# Patient Record
Sex: Female | Born: 2000 | Hispanic: No | Marital: Single | Smoking: Never smoker
Health system: Southern US, Community
[De-identification: ages and names within clinical notes are randomized; demographics above are authoritative.]

## PROBLEM LIST (undated history)

## (undated) DIAGNOSIS — Z8619 Personal history of other infectious and parasitic diseases: Secondary | ICD-10-CM

## (undated) HISTORY — DX: Personal history of other infectious and parasitic diseases: Z86.19

## (undated) HISTORY — PX: WISDOM TOOTH EXTRACTION: SHX21

---
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
## 2021-01-03 ENCOUNTER — Ambulatory Visit (HOSPITAL_COMMUNITY): Payer: Self-pay

## 2021-01-21 ENCOUNTER — Encounter: Payer: Self-pay | Admitting: Student

## 2021-01-21 ENCOUNTER — Inpatient Hospital Stay (HOSPITAL_COMMUNITY)
Admission: AD | Admit: 2021-01-21 | Discharge: 2021-01-21 | Disposition: A | Discharge status: Home / Self Care | Payer: BC Managed Care – PPO | Attending: Obstetrics & Gynecology | Admitting: Obstetrics & Gynecology

## 2021-01-21 ENCOUNTER — Other Ambulatory Visit: Payer: Self-pay

## 2021-01-21 DIAGNOSIS — N926 Irregular menstruation, unspecified: Secondary | ICD-10-CM

## 2021-01-21 NOTE — Discharge Instructions (Signed)
Return to care  If you have heavier bleeding that soaks through more that 2 pads per hour for an hour or more If you bleed so much that you feel like you might pass out or you do pass out If you have significant abdominal pain that is not improved with Tylenol     Safe Medications in Pregnancy   Acne: Benzoyl Peroxide Salicylic Acid  Backache/Headache: Tylenol: 2 regular strength every 4 hours OR              2 Extra strength every 6 hours  Colds/Coughs/Allergies: Benadryl (alcohol free) 25 mg every 6 hours as needed Breath right strips Claritin Cepacol throat lozenges Chloraseptic throat spray Cold-Eeze- up to three times per day Cough drops, alcohol free Flonase (by prescription only) Guaifenesin Mucinex Robitussin DM (plain only, alcohol free) Saline nasal spray/drops Sudafed (pseudoephedrine) & Actifed ** use only after [redacted] weeks gestation and if you do not have high blood pressure Tylenol Vicks Vaporub Zinc lozenges Zyrtec   Constipation: Colace Ducolax suppositories Fleet enema Glycerin suppositories Metamucil Milk of magnesia Miralax Senokot Smooth move tea  Diarrhea: Kaopectate Imodium A-D  *NO pepto Bismol  Hemorrhoids: Anusol Anusol HC Preparation H Tucks  Indigestion: Tums Maalox Mylanta Zantac  Pepcid  Insomnia: Benadryl (alcohol free) 25mg every 6 hours as needed Tylenol PM Unisom, no Gelcaps  Leg Cramps: Tums MagGel  Nausea/Vomiting:  Bonine Dramamine Emetrol Ginger extract Sea bands Meclizine  Nausea medication to take during pregnancy:  Unisom (doxylamine succinate 25 mg tablets) Take one tablet daily at bedtime. If symptoms are not adequately controlled, the dose can be increased to a maximum recommended dose of two tablets daily (1/2 tablet in the morning, 1/2 tablet mid-afternoon and one at bedtime). Vitamin B6 100mg tablets. Take one tablet twice a day (up to 200 mg per day).  Skin Rashes: Aveeno  products Benadryl cream or 25mg every 6 hours as needed Calamine Lotion 1% cortisone cream  Yeast infection: Gyne-lotrimin 7 Monistat 7  Gum/tooth pain: Anbesol  **If taking multiple medications, please check labels to avoid duplicating the same active ingredients **take medication as directed on the label ** Do not exceed 4000 mg of tylenol in 24 hours **Do not take medications that contain aspirin or ibuprofen    

## 2021-01-21 NOTE — MAU Note (Signed)
Took a pregnancy test today, was positive.  Has been cramping in lower abd a couple wks ago, none since. No bleeding..  Wanting confirmation.  Recently preg- IUFD at [facility] in Feb.

## 2021-01-21 NOTE — MAU Provider Note (Signed)
  S:   20 y.o. G1P0010 @Unknown  by LMP presents to MAU for pregnancy confirmation.  She denies abdominal pain or vaginal bleeding today.    O: BP 127/67 (BP Location: Right Arm)   Pulse 81   Temp 98.6 F (37 C) (Oral)   Resp 16   Ht 5' 3.5" (1.613 m)   Wt 80.3 kg   LMP 12/21/2020   SpO2 100%   Breastfeeding Unknown   BMI 30.86 kg/m  Physical Examination: General appearance - alert, well appearing, and in no distress, oriented to person, place, and time and acyanotic, in no respiratory distress  No results found for this or any previous visit (from the past 48 hour(s)).  A: Missed menses  P: D/C home Informed pt we do not do pregnancy verifications in MAU Patient is a [facility] patient but would like to go to [facility] - had recent 2nd trimester loss - will send message to office to have pregnancy confirmation & set up for viability  [facility], NP 5:25 PM

## 2021-01-25 ENCOUNTER — Telehealth: Payer: Self-pay | Admitting: Radiology

## 2021-01-25 NOTE — Telephone Encounter (Signed)
Left voicemail of appointment time change from 8:30 to 11:30 on 02/10/21

## 2021-02-10 ENCOUNTER — Other Ambulatory Visit: Payer: Self-pay

## 2021-02-10 ENCOUNTER — Ambulatory Visit (INDEPENDENT_AMBULATORY_CARE_PROVIDER_SITE_OTHER): Payer: BC Managed Care – PPO | Admitting: *Deleted

## 2021-02-10 ENCOUNTER — Ambulatory Visit: Payer: Self-pay

## 2021-02-10 ENCOUNTER — Encounter: Payer: Self-pay | Admitting: *Deleted

## 2021-02-10 VITALS — BP 127/72 | HR 73 | Wt 184.0 lb

## 2021-02-10 DIAGNOSIS — Z3A01 Less than 8 weeks gestation of pregnancy: Secondary | ICD-10-CM | POA: Diagnosis not present

## 2021-02-10 DIAGNOSIS — N926 Irregular menstruation, unspecified: Secondary | ICD-10-CM

## 2021-02-10 DIAGNOSIS — O3680X0 Pregnancy with inconclusive fetal viability, not applicable or unspecified: Secondary | ICD-10-CM

## 2021-02-10 DIAGNOSIS — Z3480 Encounter for supervision of other normal pregnancy, unspecified trimester: Secondary | ICD-10-CM | POA: Insufficient documentation

## 2021-02-10 DIAGNOSIS — Z3201 Encounter for pregnancy test, result positive: Secondary | ICD-10-CM

## 2021-02-10 DIAGNOSIS — O0990 Supervision of high risk pregnancy, unspecified, unspecified trimester: Secondary | ICD-10-CM | POA: Insufficient documentation

## 2021-02-10 LAB — POCT URINE PREGNANCY: Preg Test, Ur: POSITIVE — AB

## 2021-02-10 NOTE — Progress Notes (Signed)
DATING AND VIABILITY SONOGRAM   Amy Singh is a 20 y.o. year old G2P0010 with LMP Patient's last menstrual period was 12/21/2020. which would correlate to  [redacted]w[redacted]d weeks gestation.  This was her first period since her IUFD in February.  She is here today for a confirmatory initial sonogram.  Reviewed allergies  and medication. Pt to start taking PNV. Reviewed medical/surgical and OB history.  Denies any pain or VB.    GESTATION: SINGLETON Yes  FETAL ACTIVITY:          Heart rate      119          The fetus is current.    GESTATIONAL AGE AND  BIOMETRICS:  Gestational criteria: Estimated Date of Delivery: 09/27/21 by LMP now at [redacted]w[redacted]d  Previous Scans:0  GESTATIONAL SAC            mm         weeks  CROWN RUMP LENGTH           0.67cm         6.5 weeks                                                   AVERAGE EGA(BY THIS SCAN):  6.5 weeks  WORKING EDD( LMP ):  09/27/2021     TECHNICIAN COMMENTS: Patient informed that the ultrasound is considered a limited obstetric ultrasound and is not intended to be a complete ultrasound exam.  Patient also informed that the ultrasound is not being completed with the intent of assessing for fetal or placental anomalies or any pelvic abnormalities. Explained that the purpose of today's ultrasound is to assess for fetal heart rate.  Patient acknowledges the purpose of the exam and the limitations of the study.           A copy of this report including all images has been saved and backed up to a second source for retrieval if needed. All measures and details of the anatomical scan, placentation, fluid volume and pelvic anatomy are contained in that report.   Pt to follow up in 3-4 week for New OB visit with provider.   Scheryl Marten 02/10/2021 12:06 PM

## 2021-02-10 NOTE — Progress Notes (Signed)
Patient was assessed and managed by nursing staff during this encounter. I have reviewed the chart and agree with the documentation and plan.   Jaynie Collins, MD 02/10/2021 12:57 PM

## 2021-03-14 ENCOUNTER — Encounter: Payer: Self-pay | Admitting: Obstetrics and Gynecology

## 2021-03-14 ENCOUNTER — Ambulatory Visit (INDEPENDENT_AMBULATORY_CARE_PROVIDER_SITE_OTHER): Payer: BC Managed Care – PPO | Admitting: Obstetrics and Gynecology

## 2021-03-14 ENCOUNTER — Other Ambulatory Visit: Payer: Self-pay

## 2021-03-14 ENCOUNTER — Other Ambulatory Visit (HOSPITAL_COMMUNITY)
Admission: RE | Admit: 2021-03-14 | Discharge: 2021-03-14 | Disposition: A | Discharge status: Home / Self Care | Payer: BC Managed Care – PPO | Source: Ambulatory Visit | Attending: Obstetrics and Gynecology | Admitting: Obstetrics and Gynecology

## 2021-03-14 ENCOUNTER — Other Ambulatory Visit: Payer: Self-pay | Admitting: Obstetrics and Gynecology

## 2021-03-14 VITALS — BP 123/73 | HR 80 | Wt 184.6 lb

## 2021-03-14 DIAGNOSIS — O0991 Supervision of high risk pregnancy, unspecified, first trimester: Secondary | ICD-10-CM | POA: Insufficient documentation

## 2021-03-14 DIAGNOSIS — Z8759 Personal history of other complications of pregnancy, childbirth and the puerperium: Secondary | ICD-10-CM | POA: Insufficient documentation

## 2021-03-14 DIAGNOSIS — O98811 Other maternal infectious and parasitic diseases complicating pregnancy, first trimester: Secondary | ICD-10-CM | POA: Insufficient documentation

## 2021-03-14 DIAGNOSIS — Z3A11 11 weeks gestation of pregnancy: Secondary | ICD-10-CM | POA: Insufficient documentation

## 2021-03-14 DIAGNOSIS — Z3481 Encounter for supervision of other normal pregnancy, first trimester: Secondary | ICD-10-CM | POA: Diagnosis not present

## 2021-03-14 DIAGNOSIS — A749 Chlamydial infection, unspecified: Secondary | ICD-10-CM | POA: Insufficient documentation

## 2021-03-14 DIAGNOSIS — Z31430 Encounter of female for testing for genetic disease carrier status for procreative management: Secondary | ICD-10-CM | POA: Diagnosis not present

## 2021-03-14 DIAGNOSIS — O98212 Gonorrhea complicating pregnancy, second trimester: Secondary | ICD-10-CM

## 2021-03-14 DIAGNOSIS — O98812 Other maternal infectious and parasitic diseases complicating pregnancy, second trimester: Secondary | ICD-10-CM

## 2021-03-14 DIAGNOSIS — O98211 Gonorrhea complicating pregnancy, first trimester: Secondary | ICD-10-CM | POA: Diagnosis not present

## 2021-03-14 MED ORDER — PREPLUS 27-1 MG PO TABS: 1.0000 | ORAL_TABLET | Freq: Every day | ORAL | 13 refills | Status: DC

## 2021-03-14 MED ORDER — ASPIRIN EC 81 MG PO TBEC: 81.0000 mg | DELAYED_RELEASE_TABLET | Freq: Every day | ORAL | 2 refills | Status: DC

## 2021-03-14 NOTE — Progress Notes (Signed)
New OB Note  03/14/2021   Clinic: [facility]  Chief Complaint: New OB  Transfer of Care Patient: no  History of Present Illness: [name] is a 20 y.o. G2P0010 @ 11/6 weeks (EDC 09/27/2021, based on Patient's last menstrual period was 12/21/2020.=6wk u/s).  Preg complicated by has History of marijuana use; IUFD at less than 20 weeks of gestation; and Supervision of other normal pregnancy, antepartum on their problem list.   Any events prior to today's visit: no Her periods were: qmonth, regular She was using no method when she conceived.  She has Negative signs or symptoms of nausea/vomiting of pregnancy. She has Negative signs or symptoms of miscarriage or preterm labor  ROS: A 12-point review of systems was performed and negative, except as stated in the above HPI.  OBGYN History: As per HPI. 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   Qualifier: Diagnosis of  By: Barbaraann Barthel MD, [place]      Past Surgical History: Past Surgical History:  Procedure Laterality Date   WISDOM TOOTH EXTRACTION      Family History:  Family History  Problem Relation Age of Onset   Diabetes Paternal Grandmother     Social History:  Social History   Socioeconomic History   Marital status: Single    Spouse name: Not on file   Number of children: Not on file   Years of education: Not on file   Highest education level: Not on file  Occupational History   Not on file  Tobacco Use   Smoking status: Never   Smokeless tobacco: Never  Vaping Use   Vaping Use: Never  used  Substance and Sexual Activity   Alcohol use: No   Drug use: Not Currently    Types: Marijuana    Comment: 10/22/20   Sexual activity: Yes    Partners: Male    Comment: Pregnant   Other Topics Concern   Not on file  Social History Narrative   Not on file   Social Determinants of Health   Financial Resource Strain: Not on file  Food Insecurity: Not on file  Transportation Needs: Not on file  Physical Activity: Not on file  Stress: Not on file  Social Connections: Not on file  Intimate Partner Violence: Not on file    Allergy: No Known Allergies  Current Outpatient Medications: Prenatal vitamin  Physical Exam:   BP 123/73   Pulse 80   Wt 184 lb 9.6 oz (83.7 kg)   LMP 12/21/2020   BMI 32.19 kg/m  Body mass index is 32.19 kg/m. Contractions: Not present Vag. Bleeding: None. Fundal height: not applicable FHTs: 160s  General appearance: Well nourished, well developed female in no acute distress.  Neck:  Supple, normal appearance, and no thyromegaly  Cardiovascular: S1, S2 normal, no murmur, rub or gallop,  regular rate and rhythm Respiratory:  Clear to auscultation bilateral. Normal respiratory effort Abdomen: positive bowel sounds and no masses, hernias; diffusely non tender to palpation, non distended Breasts: patient denies any breast s/s Neuro/Psych:  Normal mood and affect.  Skin:  Warm and dry.   Laboratory: none  Imaging:  none  Assessment: patient doing well  Plan: 1. Supervision of high risk pregnancy in first trimester Routine care. Pt amenable to starting low dose ASA in a week or so. - CBC/D/Plt+RPR+Rh+ABO+RubIgG... - Culture, OB Urine - Genetic Screening - Cervicovaginal ancillary only - Hemoglobin A1c - TSH - Lupus anticoagulant - Cardiolipin antibodies, IgM+IgG - Beta-2 Glycoprotein I Ab,G/M - Antiphospholipid syndrome eval, bld - [place] MFM OB DETAIL +14 WK; Future - Comprehensive metabolic panel - Protein / creatinine ratio,  urine  2. History of IUFD Patient showed up to 2/23 anatomy u/s and d/w with IUFD and measuring 16wks instead at 19wks; pt had good dates. Anora showed normal female karyotype. Placenta not sent. Will do limited thrombophilia w/u - CBC/D/Plt+RPR+Rh+ABO+RubIgG... - Culture, OB Urine - Genetic Screening - Cervicovaginal ancillary only - Hemoglobin A1c - TSH - Lupus anticoagulant - Cardiolipin antibodies, IgM+IgG - Beta-2 Glycoprotein I Ab,G/M - Antiphospholipid syndrome eval, bld - [place] MFM OB DETAIL +14 WK; Future  3. [redacted] weeks gestation of pregnancy  Problem list reviewed and updated.  Follow up in 3 weeks.  The nature of [place] [facility] with multiple MDs and other Advanced Practice Providers was explained to patient; also emphasized that residents, students are part of our team.  >50% of 35 min visit spent on counseling and coordination of care.     Cornelia Copa MD Attending Center for [facility] [place])

## 2021-03-14 NOTE — Patient Instructions (Signed)
Today you are 11 weeks and 6 days  Your due date is September 27, 2021

## 2021-03-15 LAB — PROTEIN / CREATININE RATIO, URINE
Creatinine, Urine: 137.3 mg/dL
Protein, Ur: 9.9 mg/dL
Protein/Creat Ratio: 72 mg/g creat (ref 0–200)

## 2021-03-16 ENCOUNTER — Telehealth: Payer: Self-pay | Admitting: *Deleted

## 2021-03-16 DIAGNOSIS — O98219 Gonorrhea complicating pregnancy, unspecified trimester: Secondary | ICD-10-CM | POA: Insufficient documentation

## 2021-03-16 DIAGNOSIS — A749 Chlamydial infection, unspecified: Secondary | ICD-10-CM | POA: Insufficient documentation

## 2021-03-16 DIAGNOSIS — O98211 Gonorrhea complicating pregnancy, first trimester: Secondary | ICD-10-CM | POA: Insufficient documentation

## 2021-03-16 DIAGNOSIS — O98819 Other maternal infectious and parasitic diseases complicating pregnancy, unspecified trimester: Secondary | ICD-10-CM

## 2021-03-16 HISTORY — DX: Gonorrhea complicating pregnancy, unspecified trimester: O98.219

## 2021-03-16 HISTORY — DX: Chlamydial infection, unspecified: A74.9

## 2021-03-16 HISTORY — DX: Other maternal infectious and parasitic diseases complicating pregnancy, unspecified trimester: O98.819

## 2021-03-16 LAB — CBC/D/PLT+RPR+RH+ABO+RUBIGG...
Antibody Screen: NEGATIVE
Basophils Absolute: 0 10*3/uL (ref 0.0–0.2)
Basos: 0 %
EOS (ABSOLUTE): 0.1 10*3/uL (ref 0.0–0.4)
Eos: 1 %
HCV Ab: <0.1 s/co ratio (ref 0.0–0.9)
HIV Screen 4th Generation wRfx: NONREACTIVE
Hematocrit: 37.3 % (ref 34.0–46.6)
Hemoglobin: 11.9 g/dL (ref 11.1–15.9)
Hepatitis B Surface Ag: NEGATIVE
Immature Grans (Abs): 0 10*3/uL (ref 0.0–0.1)
Immature Granulocytes: 0 %
Lymphocytes Absolute: 2.2 10*3/uL (ref 0.7–3.1)
Lymphs: 26 %
MCH: 26.3 pg — ABNORMAL LOW (ref 26.6–33.0)
MCHC: 31.9 g/dL (ref 31.5–35.7)
MCV: 83 fL (ref 79–97)
Monocytes Absolute: 0.5 10*3/uL (ref 0.1–0.9)
Monocytes: 6 %
Neutrophils Absolute: 5.5 10*3/uL (ref 1.4–7.0)
Neutrophils: 67 %
Platelets: 206 10*3/uL (ref 150–450)
RBC: 4.52 x10E6/uL (ref 3.77–5.28)
RDW: 13.2 % (ref 11.7–15.4)
RPR Ser Ql: NONREACTIVE
Rh Factor: POSITIVE
Rubella Antibodies, IGG: 5.43 index (ref >0.99)
WBC: 8.3 10*3/uL (ref 3.4–10.8)

## 2021-03-16 LAB — COMPREHENSIVE METABOLIC PANEL
ALT: 9 IU/L (ref 0–32)
AST: 14 IU/L (ref 0–40)
Albumin/Globulin Ratio: 1.8 (ref 1.2–2.2)
Albumin: 4.4 g/dL (ref 3.9–5.0)
Alkaline Phosphatase: 48 IU/L (ref 42–106)
BUN/Creatinine Ratio: 16 (ref 9–23)
BUN: 10 mg/dL (ref 6–20)
Bilirubin Total: 0.5 mg/dL (ref 0.0–1.2)
CO2: 19 mmol/L — ABNORMAL LOW (ref 20–29)
Calcium: 9.5 mg/dL (ref 8.7–10.2)
Chloride: 104 mmol/L (ref 96–106)
Creatinine, Ser: 0.64 mg/dL (ref 0.57–1.00)
Globulin, Total: 2.4 g/dL (ref 1.5–4.5)
Glucose: 72 mg/dL (ref 65–99)
Potassium: 4.2 mmol/L (ref 3.5–5.2)
Sodium: 136 mmol/L (ref 134–144)
Total Protein: 6.8 g/dL (ref 6.0–8.5)
eGFR: 131 mL/min/{1.73_m2} (ref >59)

## 2021-03-16 LAB — ANTIPHOSPHOLIPID SYNDROME EVAL, BLD
APTT PPP: 24.1 s (ref 22.9–30.2)
Anticardiolipin IgG: <9 GPL U/mL (ref 0–14)
Anticardiolipin IgM: <9 MPL U/mL (ref 0–12)
Beta-2 Glyco 1 IgM: <9 GPI IgM units (ref 0–32)
Beta-2 Glyco I IgG: <9 GPI IgG units (ref 0–20)
Hexagonal Phase Phospholipid: 3 s (ref 0–11)
INR: 0.9 (ref 0.9–1.2)
PT: 9.6 s (ref 9.1–12.0)

## 2021-03-16 LAB — TSH: TSH: 1.15 u[IU]/mL (ref 0.450–4.500)

## 2021-03-16 LAB — LUPUS ANTICOAGULANT
Dilute Viper Venom Time: 30.3 s (ref 0.0–47.0)
PTT Lupus Anticoagulant: 30.9 s (ref 0.0–51.9)
Thrombin Time: 15.4 s (ref 0.0–23.0)
dPT Confirm Ratio: 1.02 Ratio (ref 0.00–1.34)
dPT: 30.6 s (ref 0.0–47.6)

## 2021-03-16 LAB — HEMOGLOBIN A1C
Est. average glucose Bld gHb Est-mCnc: 103 mg/dL
Hgb A1c MFr Bld: 5.2 % (ref 4.8–5.6)

## 2021-03-16 LAB — CERVICOVAGINAL ANCILLARY ONLY
Chlamydia: POSITIVE — AB
Comment: NEGATIVE
Comment: NEGATIVE
Comment: NORMAL
Neisseria Gonorrhea: POSITIVE — AB
Trichomonas: NEGATIVE

## 2021-03-16 LAB — HCV INTERPRETATION

## 2021-03-16 LAB — CULTURE, OB URINE

## 2021-03-16 LAB — URINE CULTURE, OB REFLEX

## 2021-03-16 LAB — COAG STUDIES INTERP REPORT

## 2021-03-16 MED ORDER — AZITHROMYCIN 500 MG PO TABS: 1000.0000 mg | ORAL_TABLET | Freq: Once | ORAL | 1 refills | Status: AC

## 2021-03-16 NOTE — Telephone Encounter (Signed)
-----   Message from [place] Bing, MD sent at 03/16/2021  3:15 PM EDT ----- Please call her and let her know. I've sent in the azithro. Please set her up to come in for the rocephin. thanks

## 2021-03-16 NOTE — Addendum Note (Signed)
Addended by: [place] Bing on: 03/16/2021 03:15 PM   Modules accepted: Orders

## 2021-03-16 NOTE — Telephone Encounter (Signed)
Pt called and is aware of results and will come in tomorrow for her injection.

## 2021-03-17 ENCOUNTER — Ambulatory Visit: Payer: BC Managed Care – PPO

## 2021-03-21 ENCOUNTER — Other Ambulatory Visit: Payer: Self-pay

## 2021-03-21 ENCOUNTER — Ambulatory Visit (INDEPENDENT_AMBULATORY_CARE_PROVIDER_SITE_OTHER): Payer: BC Managed Care – PPO | Admitting: *Deleted

## 2021-03-21 VITALS — BP 108/72 | HR 56

## 2021-03-21 DIAGNOSIS — Z3A12 12 weeks gestation of pregnancy: Secondary | ICD-10-CM

## 2021-03-21 DIAGNOSIS — O98211 Gonorrhea complicating pregnancy, first trimester: Secondary | ICD-10-CM

## 2021-03-21 MED ORDER — CEFTRIAXONE SODIUM 500 MG IJ SOLR
500.0000 mg | Freq: Once | INTRAMUSCULAR | Status: AC
  Administered 2021-03-21: 09:00:00 500 mg via INTRAMUSCULAR

## 2021-03-21 NOTE — Progress Notes (Signed)
Patient was assessed and managed by nursing staff during this encounter. I have reviewed the chart and agree with the documentation and plan. I have also made any necessary editorial changes.  [place] Bing, MD 03/21/2021 10:15 AM

## 2021-03-21 NOTE — Progress Notes (Signed)
Pt here for her Rocephin injection. Pt tolerated injection well. Will have TOC in about 4 weeks

## 2021-03-24 ENCOUNTER — Telehealth: Payer: Self-pay

## 2021-03-24 ENCOUNTER — Other Ambulatory Visit: Payer: Self-pay

## 2021-03-24 ENCOUNTER — Encounter: Payer: Self-pay | Admitting: Obstetrics and Gynecology

## 2021-03-24 DIAGNOSIS — D563 Thalassemia minor: Secondary | ICD-10-CM | POA: Insufficient documentation

## 2021-03-24 HISTORY — DX: Thalassemia minor: D56.3

## 2021-03-24 NOTE — Telephone Encounter (Signed)
Error

## 2021-03-24 NOTE — Telephone Encounter (Signed)
Erroneous

## 2021-03-24 NOTE — Telephone Encounter (Signed)
Attempted to call pt to go over genetic testing results.  Pt did not answer-left vm

## 2021-03-30 ENCOUNTER — Telehealth: Payer: Self-pay | Admitting: *Deleted

## 2021-03-30 MED ORDER — DOXYLAMINE-PYRIDOXINE 10-10 MG PO TBEC: 2.0000 | DELAYED_RELEASE_TABLET | Freq: Every day | ORAL | 5 refills | Status: DC

## 2021-03-30 MED ORDER — PROMETHAZINE HCL 25 MG PO TABS: 25.0000 mg | ORAL_TABLET | Freq: Four times a day (QID) | ORAL | 2 refills | Status: DC | PRN

## 2021-03-30 NOTE — Telephone Encounter (Signed)
-----   Message from Rozann Lesches, NT sent at 03/30/2021 11:45 AM EDT ----- Regarding: Please call patient about nausea Please call patient about nausea and vomiting.

## 2021-03-30 NOTE — Telephone Encounter (Signed)
Pt requesting meds for nausea and vomiting. Will send in meds to [facility]

## 2021-04-12 ENCOUNTER — Encounter: Payer: Self-pay | Admitting: Obstetrics and Gynecology

## 2021-04-12 ENCOUNTER — Other Ambulatory Visit: Payer: Self-pay

## 2021-04-12 ENCOUNTER — Other Ambulatory Visit (HOSPITAL_COMMUNITY)
Admission: RE | Admit: 2021-04-12 | Discharge: 2021-04-12 | Disposition: A | Discharge status: Home / Self Care | Payer: BC Managed Care – PPO | Source: Ambulatory Visit | Attending: Obstetrics and Gynecology | Admitting: Obstetrics and Gynecology

## 2021-04-12 ENCOUNTER — Ambulatory Visit (INDEPENDENT_AMBULATORY_CARE_PROVIDER_SITE_OTHER): Payer: BC Managed Care – PPO | Admitting: Obstetrics and Gynecology

## 2021-04-12 VITALS — BP 111/68 | HR 80 | Wt 186.0 lb

## 2021-04-12 DIAGNOSIS — Z3480 Encounter for supervision of other normal pregnancy, unspecified trimester: Secondary | ICD-10-CM

## 2021-04-12 DIAGNOSIS — D563 Thalassemia minor: Secondary | ICD-10-CM

## 2021-04-12 DIAGNOSIS — A749 Chlamydial infection, unspecified: Secondary | ICD-10-CM

## 2021-04-12 DIAGNOSIS — Z3A16 16 weeks gestation of pregnancy: Secondary | ICD-10-CM

## 2021-04-12 DIAGNOSIS — O98812 Other maternal infectious and parasitic diseases complicating pregnancy, second trimester: Secondary | ICD-10-CM

## 2021-04-12 DIAGNOSIS — O98212 Gonorrhea complicating pregnancy, second trimester: Secondary | ICD-10-CM

## 2021-04-12 NOTE — Progress Notes (Signed)
   PRENATAL VISIT NOTE  Subjective:  Amy Singh is a 20 y.o. G2P0010 at [redacted]w[redacted]d being seen today for ongoing prenatal care.  She is currently monitored for the following issues for this low-risk pregnancy and has History of marijuana use; IUFD at less than 20 weeks of gestation; Supervision of other normal pregnancy, antepartum; Gonorrhea affecting pregnancy; Chlamydia infection affecting pregnancy; and Alpha thalassemia silent carrier on their problem list.  Patient reports no complaints.  Contractions: Not present. Vag. Bleeding: None.   . Denies leaking of fluid.   The following portions of the patient's history were reviewed and updated as appropriate: allergies, current medications, past family history, past medical history, past social history, past surgical history and problem list.   Objective:   Vitals:   04/12/21 1441  BP: 111/68  Pulse: 80  Weight: 186 lb (84.4 kg)    Fetal Status: Fetal Heart Rate (bpm): 150         General:  Alert, oriented and cooperative. Patient is in no acute distress.  Skin: Skin is warm and dry. No rash noted.   Cardiovascular: Normal heart rate noted  Respiratory: Normal respiratory effort, no problems with respiration noted  Abdomen: Soft, gravid, appropriate for gestational age.  Pain/Pressure: Absent     Pelvic: Cervical exam deferred        Extremities: Normal range of motion.     Mental Status: Normal mood and affect. Normal behavior. Normal judgment and thought content.   Assessment and Plan:  Pregnancy: G2P0010 at [redacted]w[redacted]d 1. Supervision of other normal pregnancy, antepartum Patient is doing well without complaints AFP today  2. Alpha thalassemia silent carrier Patient was contacted by [place] genetic counselor and is interested in partner testing  3. Chlamydia infection affecting pregnancy in second trimester TOC today  4. Gonorrhea affecting pregnancy in second trimester TOC today  Preterm labor symptoms and general obstetric  precautions including but not limited to vaginal bleeding, contractions, leaking of fluid and fetal movement were reviewed in detail with the patient. Please refer to After Visit Summary for other counseling recommendations.   Return in about 4 weeks (around 05/10/2021) for in person, ROB.  Future Appointments  Date Time Provider Department Center  05/03/2021  9:30 AM [facility] NURSE [facility]  05/03/2021  9:45 AM [facility]4 [facility]    Catalina Antigua, MD

## 2021-04-13 IMAGING — US US OB COMP LESS 14 WK
1 series · 13 of 28 positions shown · non-contrast
Comparison: 08/13/2020

CLINICAL DATA: LEFT lower abdominal pain and vomiting for 3 days,
pregnant; assigned EGA 10 weeks 5 days based on first ultrasound

EXAM:
OBSTETRIC <14 WK ULTRASOUND
TECHNIQUE: Transabdominal ultrasound was performed for evaluation of the
gestation as well as the maternal uterus and adnexal regions.

[Series 1: us ob comp less 14 wk · 13 of 114 slices shown]
[im 5/114]
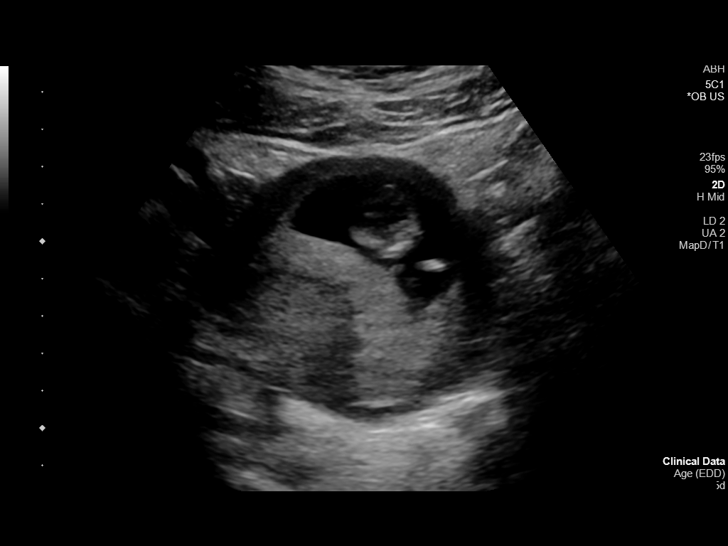
[im 13/114]
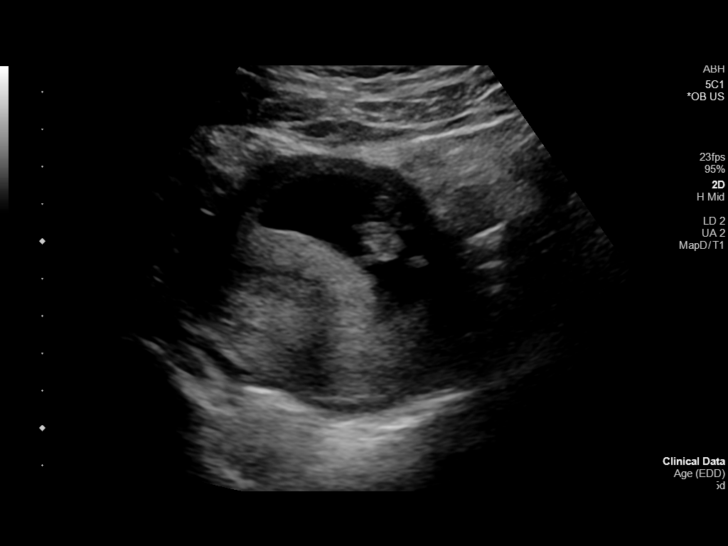
[im 21/114]
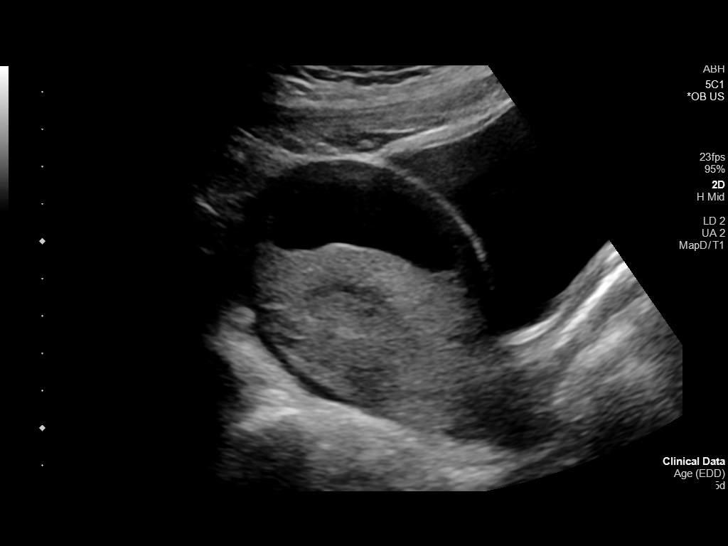
[im 30/114]
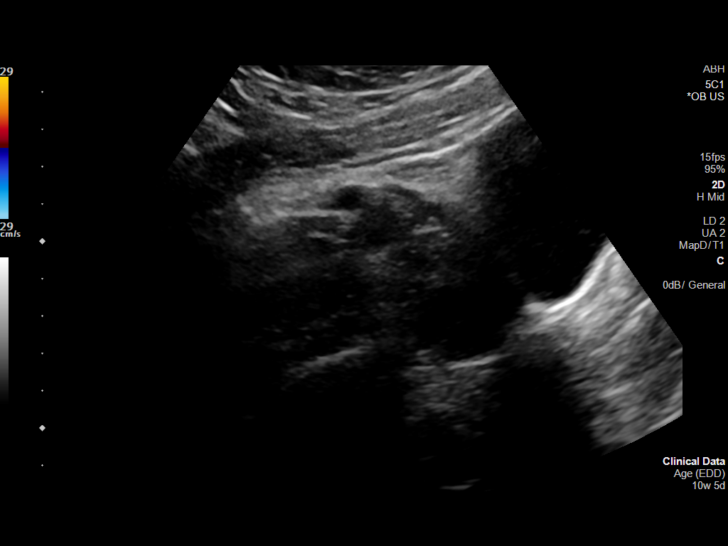
[im 38/114]
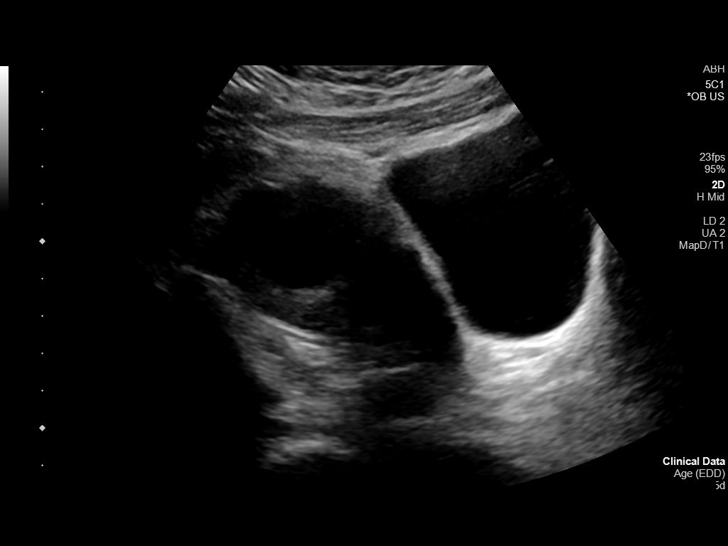
[im 47/114]
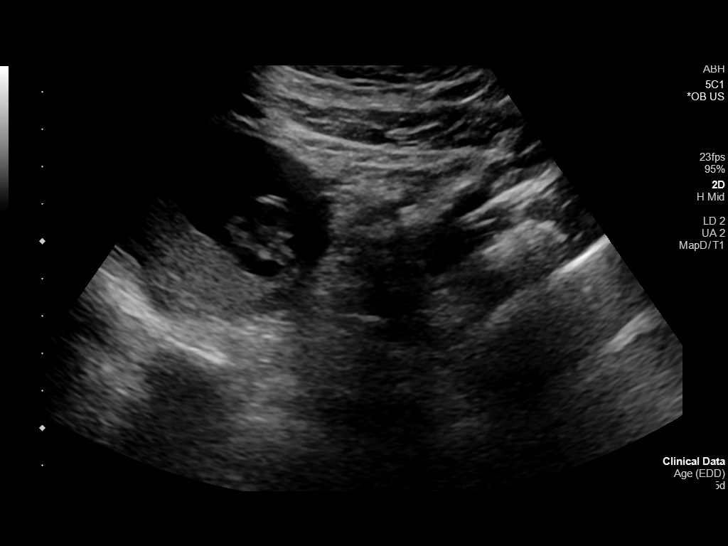
[im 59/114]
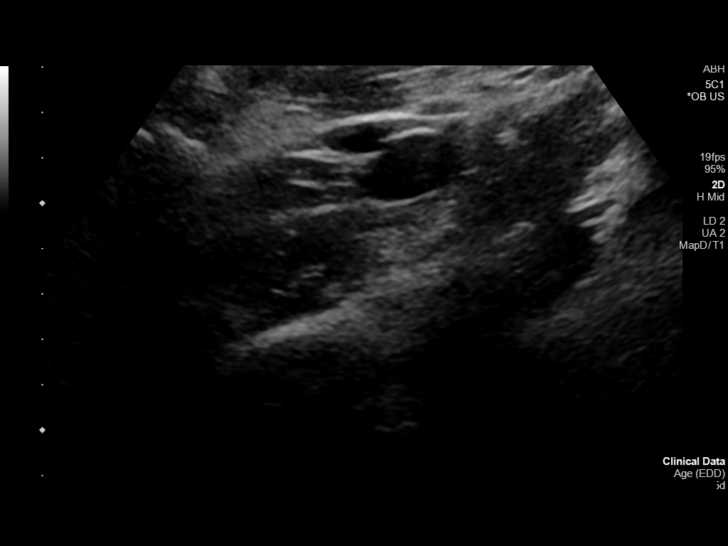
[im 67/114]
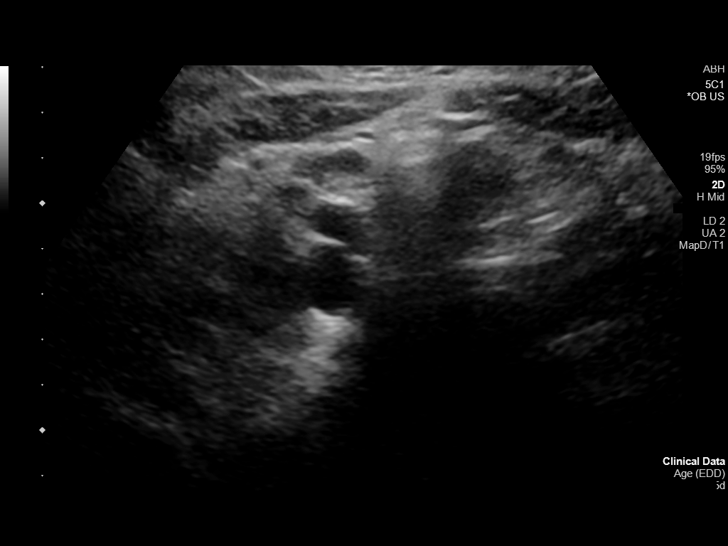
[im 76/114]
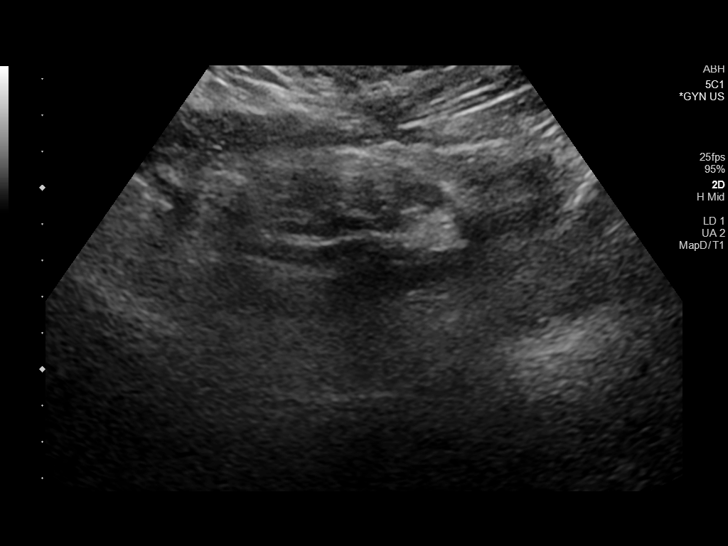
[im 84/114]
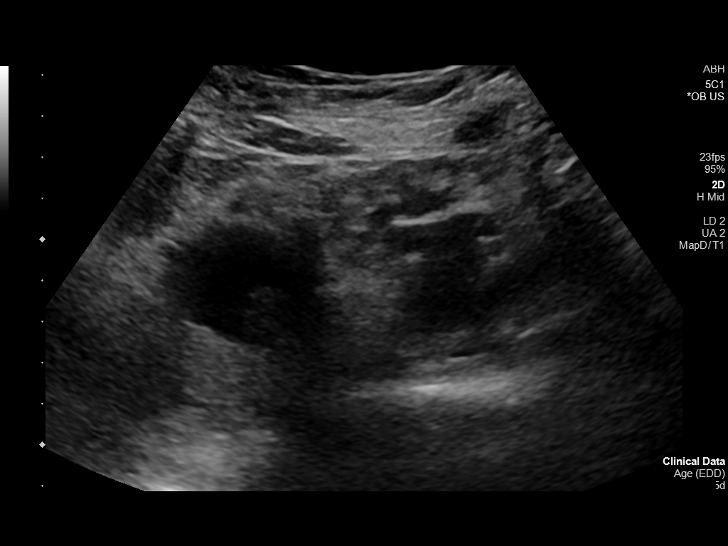
[im 93/114]
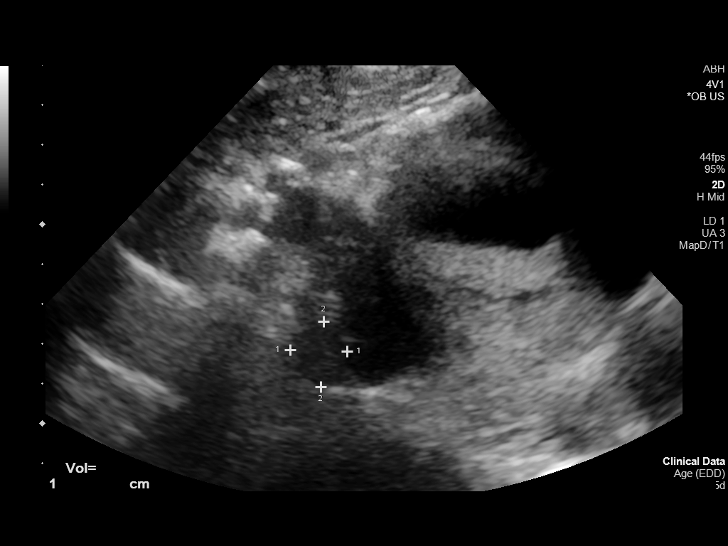
[im 101/114]
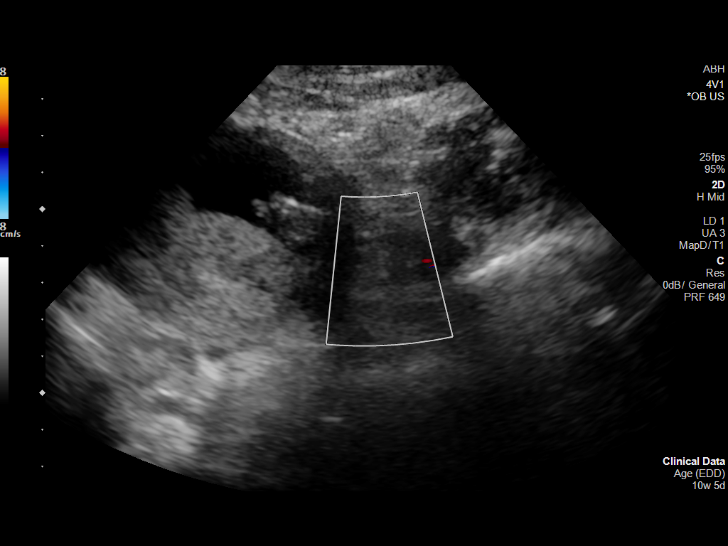
[im 109/114]
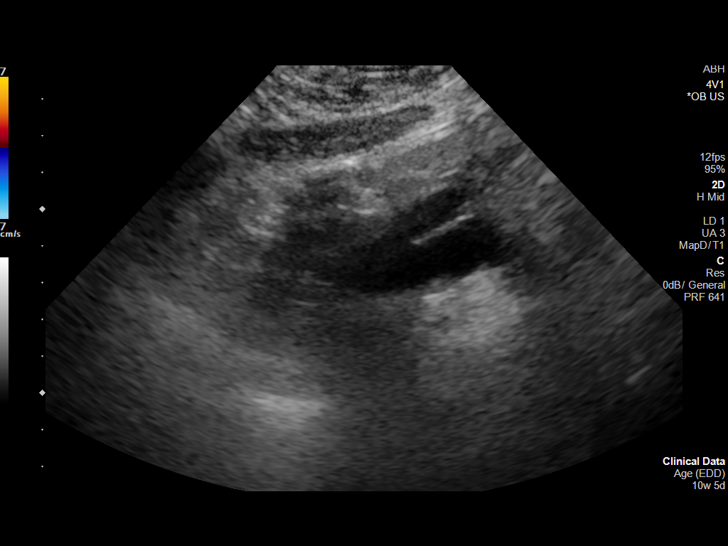

[13 of 28 positions shown; findings below may reference images not displayed]

FINDINGS: Intrauterine gestational sac: Present, single

Yolk sac:  Present

Embryo:  Present

Cardiac Activity: Present

Heart Rate: 169 bpm

CRL:   37 mm   10 w 4 d                  US EDC: 04/11/2021

Subchorionic hemorrhage:  None visualized.

Maternal uterus/adnexae:

Developing gestational sac is at the anterior aspect of the upper
uterus with thinning of the overlying anterior/fundal uterine wall
measures 5-6 mm thick.

Based on prior ultrasound, this did not appear to represent an
abnormal implantation site.

Developing placenta appears posterior.

No adnexal masses or free pelvic fluid.

Ovaries not well visualized due to bowel in adnexa.

No free pelvic fluid or adnexal masses.
IMPRESSION: Single live intrauterine gestation at 10 weeks 4 days EGA by
crown-rump length.

Gestational sac is at the anterior upper uterus with thinning of the
overlying uterine wall which measures 5-6 mm thick; recommend close
attention on follow-up imaging.

Findings called to Deiwis Bayer PA on 09/17/2020 at 0565 hrs.

## 2021-04-14 LAB — AFP, SERUM, OPEN SPINA BIFIDA
AFP MoM: 0.72
AFP Value: 21.8 ng/mL
Gest. Age on Collection Date: 16 weeks
Maternal Age At EDD: 19.3 yr
OSBR Risk 1 IN: 10000
Test Results:: NEGATIVE
Weight: 186 [lb_av]

## 2021-04-14 LAB — CERVICOVAGINAL ANCILLARY ONLY
Chlamydia: NEGATIVE
Comment: NEGATIVE
Comment: NORMAL
Neisseria Gonorrhea: NEGATIVE

## 2021-04-26 ENCOUNTER — Other Ambulatory Visit: Payer: Self-pay | Admitting: *Deleted

## 2021-04-26 NOTE — Telephone Encounter (Signed)
erroneous

## 2021-05-03 ENCOUNTER — Ambulatory Visit: Payer: BC Managed Care – PPO | Attending: Obstetrics and Gynecology

## 2021-05-03 ENCOUNTER — Other Ambulatory Visit: Payer: Self-pay

## 2021-05-03 ENCOUNTER — Encounter: Payer: Self-pay | Admitting: *Deleted

## 2021-05-03 ENCOUNTER — Ambulatory Visit: Payer: BC Managed Care – PPO | Admitting: *Deleted

## 2021-05-03 ENCOUNTER — Other Ambulatory Visit: Payer: Self-pay | Admitting: *Deleted

## 2021-05-03 VITALS — BP 124/64 | HR 70

## 2021-05-03 DIAGNOSIS — Z8759 Personal history of other complications of pregnancy, childbirth and the puerperium: Secondary | ICD-10-CM | POA: Insufficient documentation

## 2021-05-03 DIAGNOSIS — Z3480 Encounter for supervision of other normal pregnancy, unspecified trimester: Secondary | ICD-10-CM | POA: Insufficient documentation

## 2021-05-03 DIAGNOSIS — Z6830 Body mass index (BMI) 30.0-30.9, adult: Secondary | ICD-10-CM

## 2021-05-03 DIAGNOSIS — O0991 Supervision of high risk pregnancy, unspecified, first trimester: Secondary | ICD-10-CM | POA: Diagnosis not present

## 2021-05-10 ENCOUNTER — Other Ambulatory Visit: Payer: Self-pay

## 2021-05-10 ENCOUNTER — Encounter: Payer: BC Managed Care – PPO | Admitting: Obstetrics and Gynecology

## 2021-05-10 ENCOUNTER — Ambulatory Visit (INDEPENDENT_AMBULATORY_CARE_PROVIDER_SITE_OTHER): Payer: BC Managed Care – PPO | Admitting: Obstetrics and Gynecology

## 2021-05-10 VITALS — BP 127/75 | HR 91 | Wt 203.4 lb

## 2021-05-10 DIAGNOSIS — O021 Missed abortion: Secondary | ICD-10-CM

## 2021-05-10 DIAGNOSIS — O0990 Supervision of high risk pregnancy, unspecified, unspecified trimester: Secondary | ICD-10-CM

## 2021-05-10 DIAGNOSIS — Z3A20 20 weeks gestation of pregnancy: Secondary | ICD-10-CM

## 2021-05-10 DIAGNOSIS — O98212 Gonorrhea complicating pregnancy, second trimester: Secondary | ICD-10-CM

## 2021-05-10 NOTE — Progress Notes (Signed)
   PRENATAL VISIT NOTE  Subjective:  Amy Singh is a 20 y.o. G2P0010 at [redacted]w[redacted]d being seen today for ongoing prenatal care.  She is currently monitored for the following issues for this high-risk pregnancy and has History of marijuana use; IUFD at less than 20 weeks of gestation; Supervision of high risk pregnancy, antepartum; Gonorrhea affecting pregnancy; Chlamydia infection affecting pregnancy; and Alpha thalassemia silent carrier on their problem list.  Patient reports no complaints.  Contractions: Not present. Vag. Bleeding: None.  Movement: Present. Denies leaking of fluid.   The following portions of the patient's history were reviewed and updated as appropriate: allergies, current medications, past family history, past medical history, past social history, past surgical history and problem list.   Objective:   Vitals:   05/10/21 1322  BP: 127/75  Pulse: 91  Weight: 203 lb 6.4 oz (92.3 kg)    Fetal Status: Fetal Heart Rate (bpm): 154   Movement: Present     General:  Alert, oriented and cooperative. Patient is in no acute distress.  Skin: Skin is warm and dry. No rash noted.   Cardiovascular: Normal heart rate noted  Respiratory: Normal respiratory effort, no problems with respiration noted  Abdomen: Soft, gravid, appropriate for gestational age.  Pain/Pressure: Absent     Pelvic: Cervical exam deferred        Extremities: Normal range of motion.     Mental Status: Normal mood and affect. Normal behavior. Normal judgment and thought content.   Assessment and Plan:  Pregnancy: G2P0010 at [redacted]w[redacted]d 1. Supervision of high risk pregnancy, antepartum Routine care. Has completion anatomy u/s next montn  2. [redacted] weeks gestation of pregnancy  3. IUFD at less than 20 weeks of gestation  Preterm labor symptoms and general obstetric precautions including but not limited to vaginal bleeding, contractions, leaking of fluid and fetal movement were reviewed in detail with the  patient. Please refer to After Visit Summary for other counseling recommendations.   Return in about 1 month (around 06/10/2021) for low risk ob, md or app, in person.  Future Appointments  Date Time Provider Department Center  05/31/2021 12:30 PM [facility] NURSE [facility]  05/31/2021 12:45 PM [facility]    Thurston Bing, MD

## 2021-05-15 ENCOUNTER — Other Ambulatory Visit: Payer: Self-pay

## 2021-05-15 ENCOUNTER — Inpatient Hospital Stay (HOSPITAL_COMMUNITY)
Admission: AD | Admit: 2021-05-15 | Discharge: 2021-05-15 | Disposition: A | Discharge status: Home / Self Care | Payer: BC Managed Care – PPO | Attending: Obstetrics and Gynecology | Admitting: Obstetrics and Gynecology

## 2021-05-15 ENCOUNTER — Encounter (HOSPITAL_COMMUNITY): Payer: Self-pay | Admitting: Obstetrics and Gynecology

## 2021-05-15 DIAGNOSIS — O99322 Drug use complicating pregnancy, second trimester: Secondary | ICD-10-CM | POA: Diagnosis not present

## 2021-05-15 DIAGNOSIS — O99320 Drug use complicating pregnancy, unspecified trimester: Secondary | ICD-10-CM | POA: Insufficient documentation

## 2021-05-15 DIAGNOSIS — O0990 Supervision of high risk pregnancy, unspecified, unspecified trimester: Secondary | ICD-10-CM

## 2021-05-15 DIAGNOSIS — Z3A20 20 weeks gestation of pregnancy: Secondary | ICD-10-CM

## 2021-05-15 DIAGNOSIS — R110 Nausea: Secondary | ICD-10-CM | POA: Diagnosis not present

## 2021-05-15 DIAGNOSIS — O26892 Other specified pregnancy related conditions, second trimester: Secondary | ICD-10-CM | POA: Diagnosis not present

## 2021-05-15 DIAGNOSIS — O0992 Supervision of high risk pregnancy, unspecified, second trimester: Secondary | ICD-10-CM | POA: Insufficient documentation

## 2021-05-15 DIAGNOSIS — O219 Vomiting of pregnancy, unspecified: Secondary | ICD-10-CM | POA: Diagnosis not present

## 2021-05-15 DIAGNOSIS — R109 Unspecified abdominal pain: Secondary | ICD-10-CM | POA: Insufficient documentation

## 2021-05-15 DIAGNOSIS — F1290 Cannabis use, unspecified, uncomplicated: Secondary | ICD-10-CM

## 2021-05-15 LAB — RAPID URINE DRUG SCREEN, HOSP PERFORMED
Amphetamines: NOT DETECTED
Barbiturates: NOT DETECTED
Benzodiazepines: NOT DETECTED
Cocaine: NOT DETECTED
Opiates: NOT DETECTED
Tetrahydrocannabinol: POSITIVE — AB

## 2021-05-15 LAB — URINALYSIS, ROUTINE W REFLEX MICROSCOPIC
Bilirubin Urine: NEGATIVE
Glucose, UA: NEGATIVE mg/dL
Hgb urine dipstick: NEGATIVE
Ketones, ur: NEGATIVE mg/dL
Nitrite: NEGATIVE
Protein, ur: NEGATIVE mg/dL
Specific Gravity, Urine: 1.017 (ref 1.005–1.030)
pH: 6 (ref 5.0–8.0)

## 2021-05-15 LAB — WET PREP, GENITAL
Sperm: NONE SEEN
Trich, Wet Prep: NONE SEEN
Yeast Wet Prep HPF POC: NONE SEEN

## 2021-05-15 NOTE — MAU Note (Signed)
.  Amy Singh is a 20 y.o. at [redacted]w[redacted]d here in MAU reporting: upper abdominal pain and nausea that started around 0830 this morning. Patient states she took a hot shower and the nausea went away. Last smoked marijuana last night. Denies VB or LOF. Denies emesis or diarrhea. She is not currently feeling the pain but it was sharp and 5/10 in her upper abdomen on the way here.   Vitals:   05/15/21 1016  BP: (!) 121/59  Pulse: 89  Resp: 14  Temp: 98.8 F (37.1 C)  SpO2: 98%     FHT:147 Lab orders placed from triage:  UA

## 2021-05-15 NOTE — MAU Provider Note (Signed)
History     CSN: [id number]  Arrival date and time: 05/15/21 1540   Event Date/Time   First Provider Initiated Contact with Patient 05/15/21 1047      Chief Complaint  Patient presents with   Abdominal Pain   Nausea   20 y.o. G2P0010 @20 .5 wks presenting with nausea and abdominal pain. Reports onset this am around 0830. She did not have vomiting. She took a hot shower and the pain and nausea resolved. Pain was in upper abdomen. She had some ice water today w/o difficulty. Denies Vb or discharge. No fevers. She admit to using MJ for poor appetite and used last night. Also reports no FM since yesterday am.    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    Review of Systems  Constitutional:  Negative for fever.  Gastrointestinal:  Positive for abdominal pain and nausea. Negative for diarrhea.  Genitourinary:  Negative for vaginal bleeding and vaginal discharge.  Physical Exam   Blood pressure 118/60, pulse 68, temperature 98.8 F (37.1 C), temperature source Oral, resp. rate 15, weight 91.5 kg, last menstrual period 12/21/2020, SpO2 100 %, unknown if currently breastfeeding.  Physical Exam Vitals  and nursing note reviewed. Exam conducted with a chaperone present.  Constitutional:      Appearance: Normal appearance.  HENT:     Head: Normocephalic.  Cardiovascular:     Rate and Rhythm: Normal rate.  Pulmonary:     Effort: Pulmonary effort is normal. No respiratory distress.  Abdominal:     General: There is no distension.     Palpations: Abdomen is soft. There is no mass.     Tenderness: There is no abdominal tenderness. There is no guarding or rebound.     Hernia: No hernia is present.  Genitourinary:    Comments: VE: closed/long Musculoskeletal:        General: Normal range of motion.     Cervical back: Normal range of motion.  Skin:    General: Skin is warm and dry.  Neurological:     General: No focal deficit present.     Mental Status: She is alert and oriented to person, place, and time.  Psychiatric:        Mood and Affect: Mood normal.  Behavior: Behavior normal.  FHT: 147  Results for orders placed or performed during the hospital encounter of 05/15/21 (from the past 24 hour(s))  Urinalysis, Routine w reflex microscopic Urine, Clean Catch     Status: Abnormal   Collection Time: 05/15/21 10:11 AM  Result Value Ref Range   Color, Urine YELLOW YELLOW   APPearance CLOUDY (A) CLEAR   Specific Gravity, Urine 1.017 1.005 - 1.030   pH 6.0 5.0 - 8.0   Glucose, UA NEGATIVE NEGATIVE mg/dL   Hgb urine dipstick NEGATIVE NEGATIVE   Bilirubin Urine NEGATIVE NEGATIVE   Ketones, ur NEGATIVE NEGATIVE mg/dL   Protein, ur NEGATIVE NEGATIVE mg/dL   Nitrite NEGATIVE NEGATIVE   Leukocytes,Ua TRACE (A) NEGATIVE   RBC / HPF 0-5 0 - 5 RBC/hpf   WBC, UA 11-20 0 - 5 WBC/hpf   Bacteria, UA RARE (A) NONE SEEN   Squamous Epithelial / LPF 21-50 0 - 5   Mucus PRESENT   Urine rapid drug screen (hosp performed)     Status: Abnormal   Collection Time: 05/15/21 10:11 AM  Result Value Ref Range   Opiates NONE DETECTED NONE DETECTED   Cocaine NONE DETECTED NONE DETECTED    Benzodiazepines NONE DETECTED NONE DETECTED   Amphetamines NONE DETECTED NONE DETECTED   Tetrahydrocannabinol POSITIVE (A) NONE DETECTED   Barbiturates NONE DETECTED NONE DETECTED  Wet prep, genital     Status: Abnormal   Collection Time: 05/15/21 10:41 AM   Specimen: Vaginal  Result Value Ref Range   Yeast Wet Prep HPF POC NONE SEEN NONE SEEN   Trich, Wet Prep NONE SEEN NONE SEEN   Clue Cells Wet Prep HPF POC PRESENT (A) NONE SEEN   WBC, Wet Prep HPF POC MODERATE (A) NONE SEEN   Sperm NONE SEEN    MAU Course  Procedures  MDM Labs ordered and reviewed. Suspect sx were from MJ use. Discussed this with pt and recommend cessation. Discussed expectation of FM at this GA. Stable for discharge home.   Assessment and Plan   1. Supervision of high risk pregnancy, antepartum   2. [redacted] weeks gestation of pregnancy   3. 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   Doxylamine-Pyridoxine 10-10 MG Tbec Commonly known as: Diclegis Take 2 tablets by mouth at bedtime. If symptoms persist, add one tablet in the morning and one in the afternoon   prenatal multivitamin Tabs tablet Take 1 tablet by mouth daily at 12 noon.   promethazine 25 MG tablet Commonly known as: PHENERGAN Take 1 tablet (25 mg total) by mouth every 6 (six) hours as needed for nausea or vomiting.       Donette Larry, CNM 05/15/2021, 1:11 PM

## 2021-05-16 LAB — GC/CHLAMYDIA PROBE AMP (~~LOC~~) NOT AT ARMC
Chlamydia: NEGATIVE
Comment: NEGATIVE
Comment: NORMAL
Neisseria Gonorrhea: NEGATIVE

## 2021-05-24 IMAGING — US US ABDOMEN LIMITED
2 series · 15 of 25 positions shown · non-contrast
Comparison: None.

CLINICAL DATA: Elevated LFTs

EXAM:
ULTRASOUND ABDOMEN LIMITED RIGHT UPPER QUADRANT

[Series 1: us abdomen limited · 8 of 47 slices shown (1 of 2)]
[im 1/47]
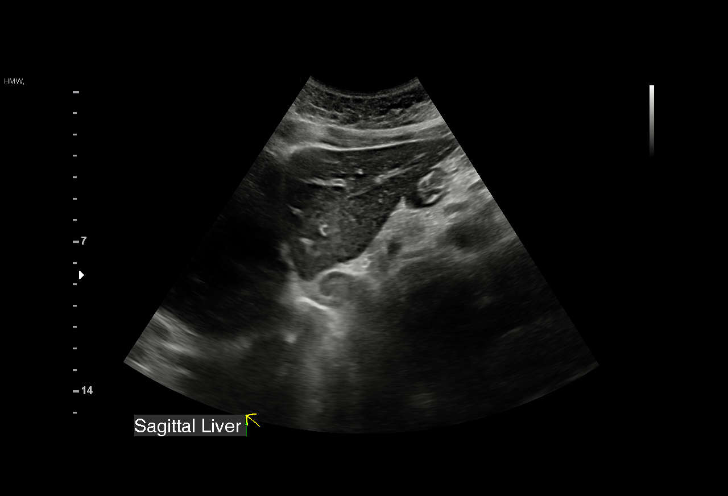
[im 8/47]
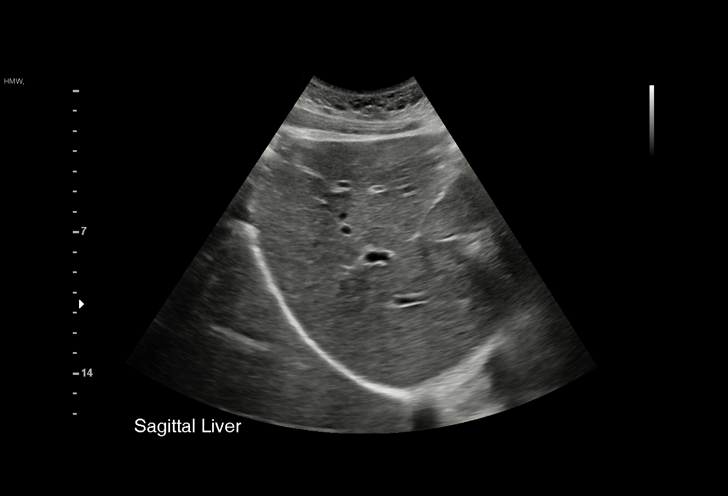
[im 16/47]
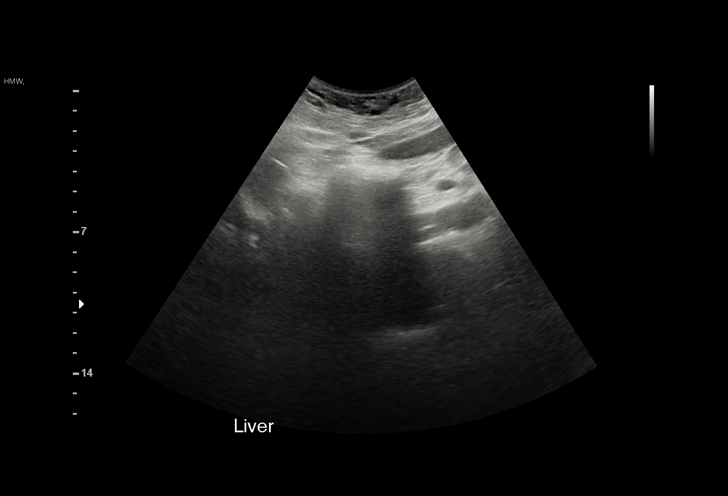
[im 20/47]
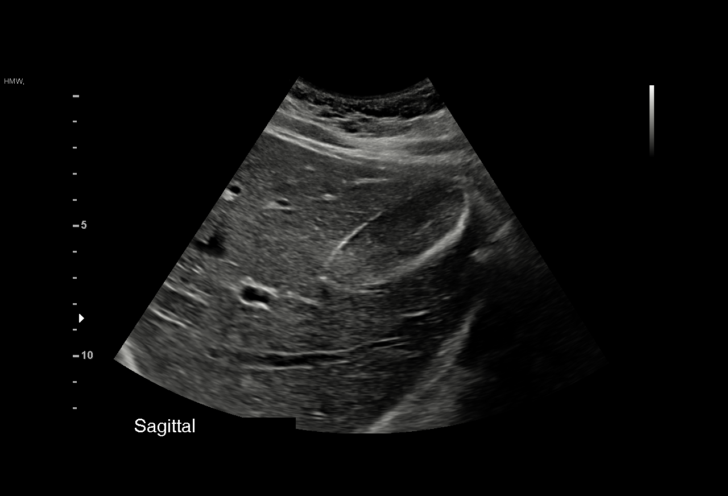
[im 27/47]
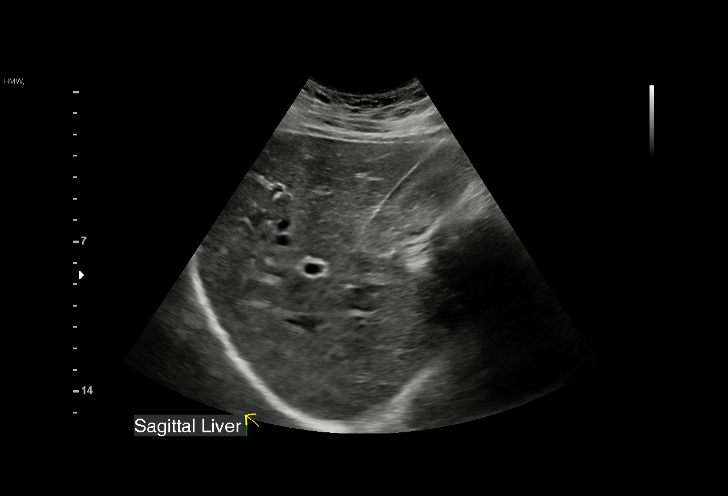
[im 35/47]
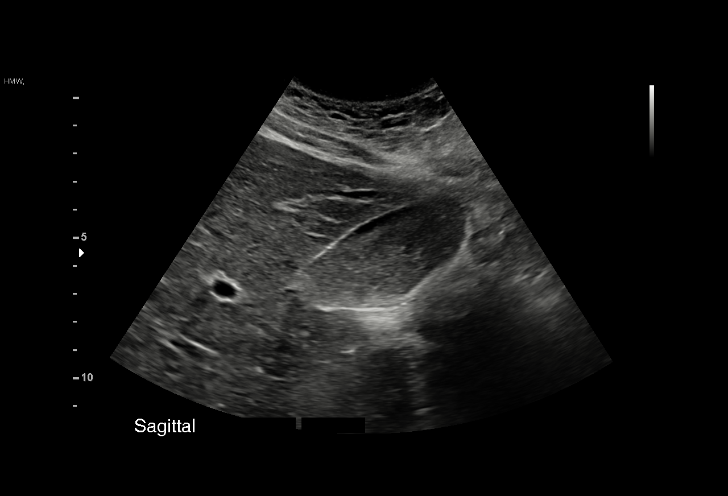
[im 39/47]
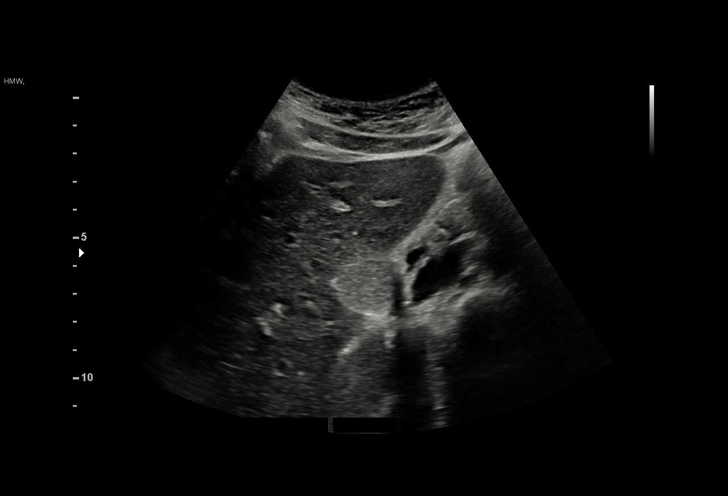
[im 47/47]
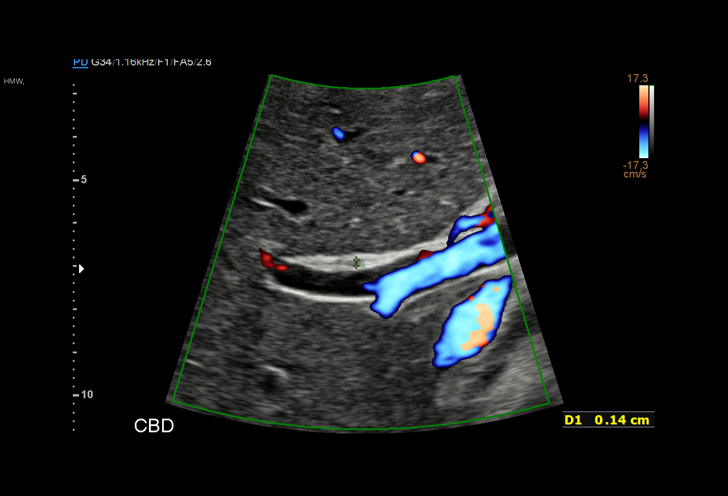

[Series 1: us abdomen limited · 7 of 45 slices shown (2 of 2)]
[im 5/45]
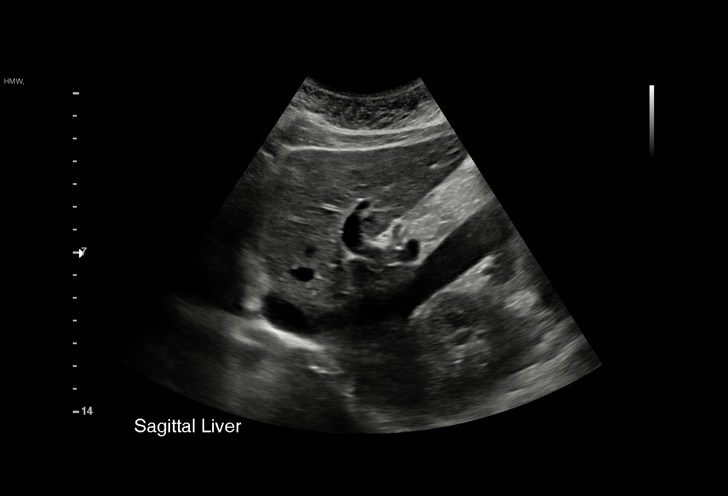
[im 9/45]
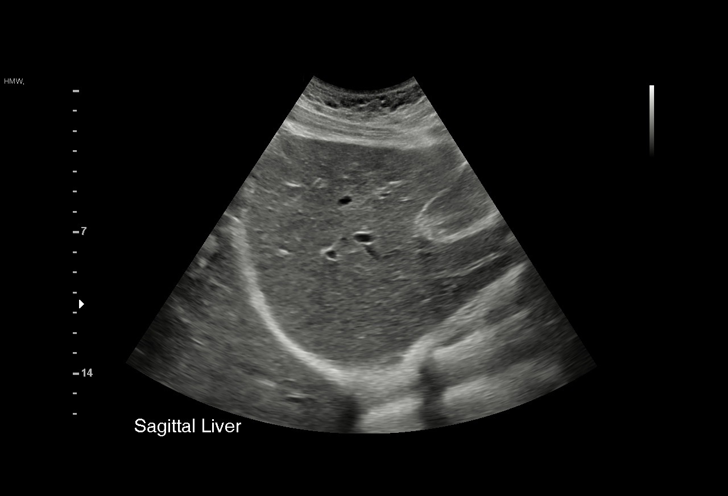
[im 17/45]
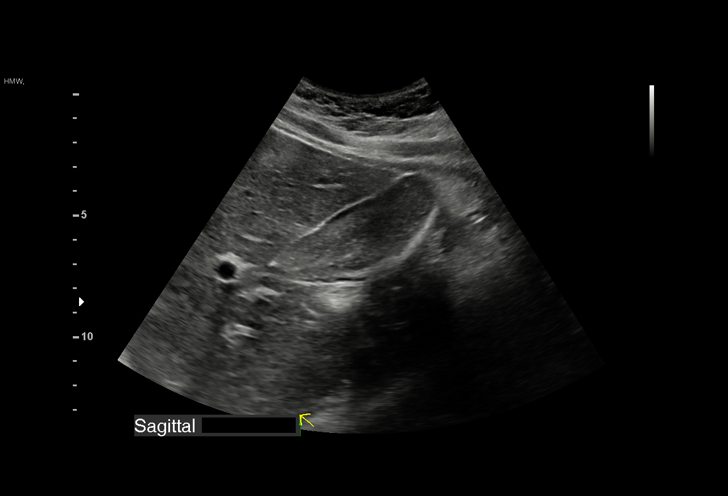
[im 25/45]
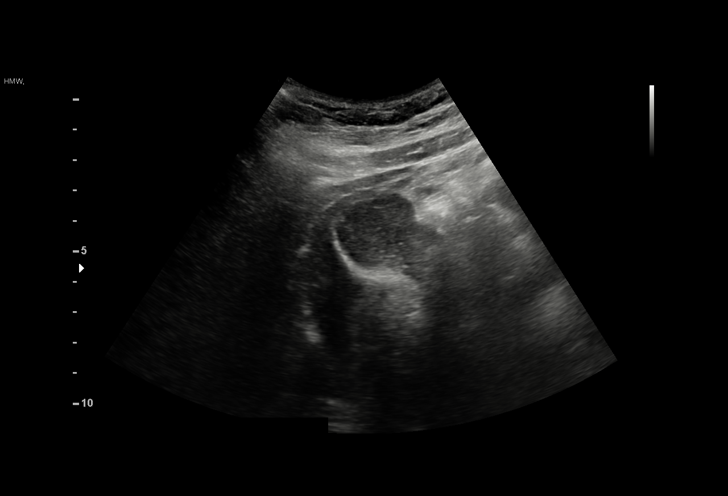
[im 29/45]
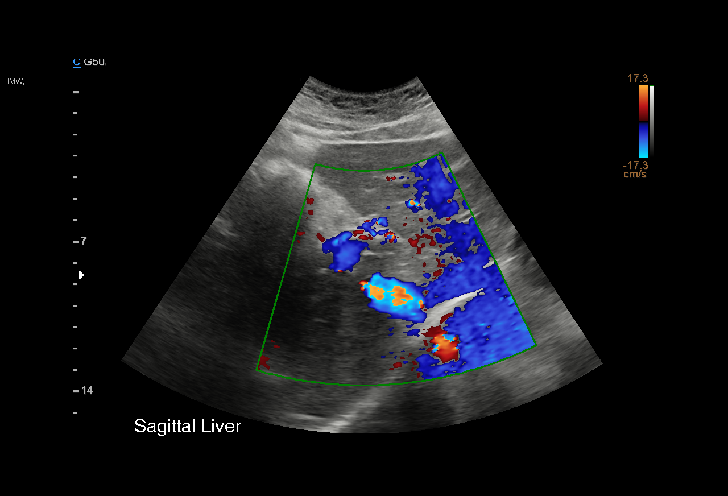
[im 37/45]
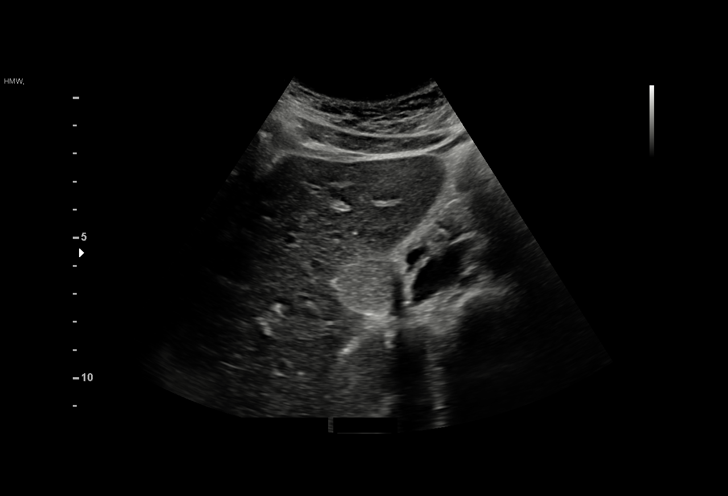
[im 45/45]
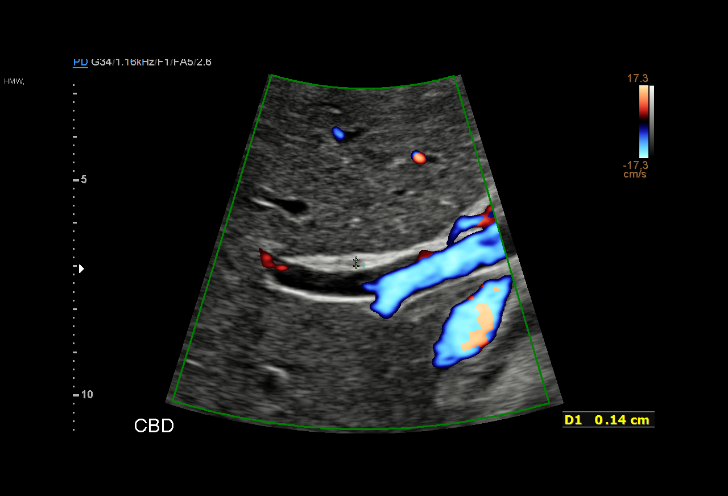

[15 of 25 positions shown; findings below may reference images not displayed]

FINDINGS: Gallbladder:

Gallbladder is well distended with sludge throughout. No definitive
gallstones are seen. No wall thickening or pericholecystic fluid is
noted.

Common bile duct:

Diameter: 1.4 mm.

Liver:

No focal lesion identified. Within normal limits in parenchymal
echogenicity. Portal vein is patent on color Doppler imaging with
normal direction of blood flow towards the liver.

Other: None.
IMPRESSION: Gallbladder sludge.  No complicating factors are noted.

## 2021-05-31 ENCOUNTER — Ambulatory Visit: Payer: BC Managed Care – PPO | Attending: Obstetrics

## 2021-05-31 ENCOUNTER — Encounter: Payer: Self-pay | Admitting: *Deleted

## 2021-05-31 ENCOUNTER — Other Ambulatory Visit: Payer: Self-pay

## 2021-05-31 ENCOUNTER — Other Ambulatory Visit: Payer: Self-pay | Admitting: *Deleted

## 2021-05-31 ENCOUNTER — Ambulatory Visit: Payer: BC Managed Care – PPO | Admitting: *Deleted

## 2021-05-31 VITALS — BP 131/69 | HR 88

## 2021-05-31 DIAGNOSIS — E669 Obesity, unspecified: Secondary | ICD-10-CM

## 2021-05-31 DIAGNOSIS — Z148 Genetic carrier of other disease: Secondary | ICD-10-CM | POA: Insufficient documentation

## 2021-05-31 DIAGNOSIS — O09292 Supervision of pregnancy with other poor reproductive or obstetric history, second trimester: Secondary | ICD-10-CM | POA: Diagnosis not present

## 2021-05-31 DIAGNOSIS — O99212 Obesity complicating pregnancy, second trimester: Secondary | ICD-10-CM | POA: Diagnosis not present

## 2021-05-31 DIAGNOSIS — O09299 Supervision of pregnancy with other poor reproductive or obstetric history, unspecified trimester: Secondary | ICD-10-CM | POA: Diagnosis not present

## 2021-05-31 DIAGNOSIS — A363 Cutaneous diphtheria: Secondary | ICD-10-CM | POA: Diagnosis not present

## 2021-05-31 DIAGNOSIS — Z8759 Personal history of other complications of pregnancy, childbirth and the puerperium: Secondary | ICD-10-CM

## 2021-05-31 DIAGNOSIS — O99322 Drug use complicating pregnancy, second trimester: Secondary | ICD-10-CM

## 2021-05-31 DIAGNOSIS — O0990 Supervision of high risk pregnancy, unspecified, unspecified trimester: Secondary | ICD-10-CM | POA: Insufficient documentation

## 2021-05-31 DIAGNOSIS — Z3A23 23 weeks gestation of pregnancy: Secondary | ICD-10-CM | POA: Diagnosis not present

## 2021-05-31 DIAGNOSIS — O0992 Supervision of high risk pregnancy, unspecified, second trimester: Secondary | ICD-10-CM | POA: Diagnosis not present

## 2021-05-31 DIAGNOSIS — Z6830 Body mass index (BMI) 30.0-30.9, adult: Secondary | ICD-10-CM

## 2021-06-07 ENCOUNTER — Other Ambulatory Visit: Payer: Self-pay

## 2021-06-07 ENCOUNTER — Ambulatory Visit (INDEPENDENT_AMBULATORY_CARE_PROVIDER_SITE_OTHER): Payer: BC Managed Care – PPO | Admitting: Obstetrics & Gynecology

## 2021-06-07 VITALS — BP 119/76 | HR 97 | Wt 214.0 lb

## 2021-06-07 DIAGNOSIS — O0990 Supervision of high risk pregnancy, unspecified, unspecified trimester: Secondary | ICD-10-CM

## 2021-06-07 DIAGNOSIS — Z3A24 24 weeks gestation of pregnancy: Secondary | ICD-10-CM

## 2021-06-07 NOTE — Patient Instructions (Addendum)
Return to office for any scheduled appointments. Call the office or go to the MAU at [facility] if: You begin to have strong, frequent contractions Your water breaks.  Sometimes it is a big gush of fluid, sometimes it is just a trickle that keeps getting your panties wet or running down your legs You have vaginal bleeding.  It is normal to have a small amount of spotting if your cervix was checked.  You do not feel your baby moving like normal.  If you do not, get something to eat and drink and lay down and focus on feeling your baby move.   If your baby is still not moving like normal, you should call the office or go to MAU. Any other obstetric concerns.   Influenza (Flu) Vaccine (Inactivated or Recombinant): What You Need to Know 1. Why get vaccinated? Influenza vaccine can prevent influenza (flu). Flu is a contagious disease that spreads around the [place] every year, usually between October and May. Anyone can get the flu, but it is more dangerous for some people. Infants and young children, people 79 years and older, pregnant people, and people with certain health conditions or a weakened immune system are at greatest risk of flu complications. Pneumonia, bronchitis, sinus infections, and ear infections are examples of flu-related complications. If you have a medical condition, such as heart disease, cancer, or diabetes, flu can make it worse. Flu can cause fever and chills, sore throat, muscle aches, fatigue, cough, headache, and runny or stuffy nose. Some people may have vomiting and diarrhea, though this is more common in children than adults. In an average year, thousands of people in the [place] die from flu, and many more are hospitalized. Flu vaccine prevents millions of illnesses and flu-related visits to the doctor each year. 2. Influenza vaccines CDC recommends everyone 6 months and older get vaccinated every flu season. Children 6 months  through 37 years of age may need 2 doses during a single flu season. Everyone else needs only 1 dose each flu season. It takes about 2 weeks for protection to develop after vaccination. There are many flu viruses, and they are always changing. Each year a new flu vaccine is made to protect against the influenza viruses believed to be likely to cause disease in the upcoming flu season. Even when the vaccine doesn't exactly match these viruses, it may still provide some protection. Influenza vaccine does not cause flu. Influenza vaccine may be given at the same time as other vaccines. 3. Talk with your health care provider Tell your vaccination provider if the person getting the vaccine: Has had an allergic reaction after a previous dose of influenza vaccine, or has any severe, life-threatening allergies Has ever had Guillain-Barr Syndrome (also called "GBS") In some cases, your health care provider may decide to postpone influenza vaccination until a future visit. Influenza vaccine can be administered at any time during pregnancy. People who are or will be pregnant during influenza season should receive inactivated influenza vaccine. People with minor illnesses, such as a cold, may be vaccinated. People who are moderately or severely ill should usually wait until they recover before getting influenza vaccine. Your health care provider can give you more information. 4. Risks of a vaccine reaction Soreness, redness, and swelling where the shot is given, fever, muscle aches, and headache can happen after influenza vaccination. There may be a very small increased risk of Guillain-Barr Syndrome (GBS) after inactivated influenza vaccine (  the flu shot). Young children who get the flu shot along with pneumococcal vaccine (PCV13) and/or DTaP vaccine at the same time might be slightly more likely to have a seizure caused by fever. Tell your health care provider if a child who is getting flu vaccine has ever  had a seizure. People sometimes faint after medical procedures, including vaccination. Tell your provider if you feel dizzy or have vision changes or ringing in the ears. As with any medicine, there is a very remote chance of a vaccine causing a severe allergic reaction, other serious injury, or death. 5. What if there is a serious problem? An allergic reaction could occur after the vaccinated person leaves the clinic. If you see signs of a severe allergic reaction (hives, swelling of the face and throat, difficulty breathing, a fast heartbeat, dizziness, or weakness), call 9-1-1 and get the person to the nearest hospital. For other signs that concern you, call your health care provider. Adverse reactions should be reported to the Vaccine Adverse Event Reporting System (VAERS). Your health care provider will usually file this report, or you can do it yourself. Visit the VAERS website at www.vaers.LAgents.no or call 585-199-0474. VAERS is only for reporting reactions, and VAERS staff members do not give medical advice. 6. The National Vaccine Injury Compensation Program The [employer] Vaccine Injury Compensation Program (VICP) is a federal program that was created to compensate people who may have been injured by certain vaccines. Claims regarding alleged injury or death due to vaccination have a time limit for filing, which may be as short as two years. Visit the VICP website at SpiritualWord.at or call 763-378-4085 to learn about the program and about filing a claim. 7. How can I learn more? Ask your health care provider. Call your local or state health department. Visit the website of the Food and Drug Administration (FDA) for vaccine package inserts and additional information at FinderList.no. Contact the Centers for Disease Control and Prevention (CDC): Call (623)075-5233 (1-800-CDC-INFO) or Visit CDC's website at BiotechRoom.com.cy. Vaccine  Information Statement Inactivated Influenza Vaccine (04/30/2020) This information is not intended to replace advice given to you by your health care provider. Make sure you discuss any questions you have with your health care provider. Document Revised: 06/17/2020 Document Reviewed: 06/17/2020 Elsevier Patient Education  2022 Elsevier Inc.   TDaP Vaccine Pregnancy Get the Whooping Cough Vaccine While You Are Pregnant (CDC)  It is important for women to get the whooping cough vaccine in the third trimester of each pregnancy. Vaccines are the best way to prevent this disease. There are 2 different whooping cough vaccines. Both vaccines combine protection against whooping cough, tetanus and diphtheria, but they are for different age groups: Tdap: for everyone 11 years or older, including pregnant women  DTaP: for children 2 months through 47 years of age  You need the whooping cough vaccine during each of your pregnancies The recommended time to get the shot is during your 27th through 36th week of pregnancy, preferably during the earlier part of this time period. The Centers for Disease Control and Prevention (CDC) recommends that pregnant women receive the whooping cough vaccine for adolescents and adults (called Tdap vaccine) during the third trimester of each pregnancy. The recommended time to get the shot is during your 27th through 36th week of pregnancy, preferably during the earlier part of this time period. This replaces the original recommendation that pregnant women get the vaccine only if they had not previously received it. The [employer] of  Obstetricians and Gynecologists and the [employer] of Nurse-Midwives support this recommendation.  You should get the whooping cough vaccine while pregnant to pass protection to your baby frame support disabled and/or not supported in this browser  Learn why Vernona Rieger decided to get the whooping cough vaccine in her 3rd trimester of pregnancy  and how her baby girl was born with some protection against the disease. Also available on YouTube. After receiving the whooping cough vaccine, your body will create protective antibodies (proteins produced by the body to fight off diseases) and pass some of them to your baby before birth. These antibodies provide your baby some short-term protection against whooping cough in early life. These antibodies can also protect your baby from some of the more serious complications that come along with whooping cough. Your protective antibodies are at their highest about 2 weeks after getting the vaccine, but it takes time to pass them to your baby. So the preferred time to get the whooping cough vaccine is early in your third trimester. The amount of whooping cough antibodies in your body decreases over time. That is why CDC recommends you get a whooping cough vaccine during each pregnancy. Doing so allows each of your babies to get the greatest number of protective antibodies from you. This means each of your babies will get the best protection possible against this disease.  Getting the whooping cough vaccine while pregnant is better than getting the vaccine after you give birth Whooping cough vaccination during pregnancy is ideal so your baby will have short-term protection as soon as he is born. This early protection is important because your baby will not start getting his whooping cough vaccines until he is 2 months old. These first few months of life are when your baby is at greatest risk for catching whooping cough. This is also when he's at greatest risk for having severe, potentially life-threating complications from the infection. To avoid that gap in protection, it is best to get a whooping cough vaccine during pregnancy. You will then pass protection to your baby before he is born. To continue protecting your baby, he should get whooping cough vaccines starting at 2 months old. You may never have  gotten the Tdap vaccine before and did not get it during this pregnancy. If so, you should make sure to get the vaccine immediately after you give birth, before leaving the hospital or birthing center. It will take about 2 weeks before your body develops protection (antibodies) in response to the vaccine. Once you have protection from the vaccine, you are less likely to give whooping cough to your newborn while caring for him. But remember, your baby will still be at risk for catching whooping cough from others. A recent study looked to see how effective Tdap was at preventing whooping cough in babies whose mothers got the vaccine while pregnant or in the hospital after giving birth. The study found that getting Tdap between 27 through 36 weeks of pregnancy is 85% more effective at preventing whooping cough in babies younger than 2 months old. Blood tests cannot tell if you need a whooping cough vaccine There are no blood tests that can tell you if you have enough antibodies in your body to protect yourself or your baby against whooping cough. Even if you have been sick with whooping cough in the past or previously received the vaccine, you still should get the vaccine during each pregnancy. Breastfeeding may pass some protective antibodies onto your baby By  breastfeeding, you may pass some antibodies you have made in response to the vaccine to your baby. When you get a whooping cough vaccine during your pregnancy, you will have antibodies in your breast milk that you can share with your baby as soon as your milk comes in. However, your baby will not get protective antibodies immediately if you wait to get the whooping cough vaccine until after delivering your baby. This is because it takes about 2 weeks for your body to create antibodies. Learn more about the health benefits of breastfeeding.

## 2021-06-07 NOTE — Progress Notes (Signed)
PRENATAL VISIT NOTE  Subjective:  Amy Singh is a 20 y.o. G2P0010 at [redacted]w[redacted]d being seen today for ongoing prenatal care.  She is currently monitored for the following issues for this high-risk pregnancy and has History of marijuana use; IUFD at less than 20 weeks of gestation; Supervision of high risk pregnancy, antepartum; Gonorrhea affecting pregnancy; Chlamydia infection affecting pregnancy; Alpha thalassemia silent carrier; and Drug use affecting pregnancy on their problem list.  Patient reports no complaints.  Contractions: Not present. Vag. Bleeding: None.  Movement: Present. Denies leaking of fluid.   The following portions of the patient's history were reviewed and updated as appropriate: allergies, current medications, past family history, past medical history, past social history, past surgical history and problem list.   Objective:   Vitals:   06/07/21 1045  BP: 119/76  Pulse: 97  Weight: 214 lb (97.1 kg)    Fetal Status: Fetal Heart Rate (bpm): 129 Fundal Height: 24 cm Movement: Present     General:  Alert, oriented and cooperative. Patient is in no acute distress.  Skin: Skin is warm and dry. No rash noted.   Cardiovascular: Normal heart rate noted  Respiratory: Normal respiratory effort, no problems with respiration noted  Abdomen: Soft, gravid, appropriate for gestational age.  Pain/Pressure: Absent     Pelvic: Cervical exam deferred        Extremities: Normal range of motion.  Edema: Trace  Mental Status: Normal mood and affect. Normal behavior. Normal judgment and thought content.   Imaging: [place] MFM OB FOLLOW UP  Result Date: 05/31/2021 ----------------------------------------------------------------------  OBSTETRICS REPORT                       (Signed Final 05/31/2021 02:24 pm) ---------------------------------------------------------------------- Patient Info  ID #:       [id number]                          D.O.B.:  08-04-2001 (19 yrs)  Name:       Amy Singh                   Visit Date: 05/31/2021 09:40 am ---------------------------------------------------------------------- Performed By  Attending:        Noralee Space MD        Ref. Address:     [address]                                                             [place], [place]                                                             [address]  Performed By:  Eden Lathe BS      [facility]  Referred By:      Tereso Newcomer MD ---------------------------------------------------------------------- Orders  #  Description                           Code        Ordered By  1  [place] MFM OB FOLLOW UP                   [id number]    YU FANG ----------------------------------------------------------------------  #  Order #                     Accession #                Episode #  1  [id number]                   [id number]                 [id number] ---------------------------------------------------------------------- Indications  [redacted] weeks gestation of pregnancy                Z3A.23  Obesity complicating pregnancy, second         O99.212  trimester (BMI 31)  Antenatal follow-up for nonvisualized fetal    Z36.2  anatomy  Poor obstetric history: Previous IUFD          O09.299  (stillbirth at 15 weeks)  Genetic carrier (Alpha Thal)                   Z14.8  LR NIPS  Drug use complicating pregnancy, second        O99.322  trimester (THC) ---------------------------------------------------------------------- Fetal Evaluation  Num Of Fetuses:         1  Fetal Heart Rate(bpm):  166  Cardiac Activity:       Observed  Presentation:           Variable  Placenta:               Fundal  P. Cord Insertion:       Visualized  Amniotic Fluid  AFI FV:      Within normal limits  Largest Pocket(cm)                              5.2 ---------------------------------------------------------------------- Biometry  BPD:      50.3  mm     G. Age:  21w 2d        2.7  %    CI:        66.46   %    70 - 86                                                          FL/HC:      20.0   %    19.2 - 20.8  HC:      197.8  mm     G. Age:  22w 0d          6  %    HC/AC:      1.12        1.05 - 1.21  AC:      176.3  mm     G. Age:  22w 4d         27  %    FL/BPD:     78.7   %    71 - 87  FL:       39.6  mm     G. Age:  22w 5d         31  %    FL/AC:      22.5   %    20 - 24  HUM:        34  mm     G. Age:  21w 4d         14  %  CER:      24.5  mm     G. Age:  22w 3d         61  %  LV:        4.5  mm  Est. FW:     510  gm      1 lb 2 oz     21  % ---------------------------------------------------------------------- OB History  Gravidity:    2  Living:       0 ---------------------------------------------------------------------- Gestational Age  LMP:           23w 0d        Date:  12/21/20                 EDD:   09/27/21  U/S Today:     22w 1d                                        EDD:   10/03/21  Best:          23w 0d     Det. By:  LMP  (12/21/20)          EDD:   09/27/21 ---------------------------------------------------------------------- Anatomy  Cranium:               Appears normal         LVOT:  Appears normal  Cavum:                 Appears normal         Aortic Arch:            Appears normal  Ventricles:            Appears normal         Ductal Arch:            Appears normal  Choroid Plexus:        Appears normal         Diaphragm:              Previously seen  Cerebellum:            Appears normal         Stomach:                Appears normal, left                                                                        sided  Posterior Fossa:       Appears normal         Abdomen:                 Appears normal  Nuchal Fold:           Previously seen        Abdominal Wall:         Not well visualized  Face:                  Appears normal         Cord Vessels:           Appears normal (3                         (orbits and profile)                           vessel cord)  Lips:                  Appears normal         Kidneys:                Appear normal  Palate:                Appears normal         Bladder:                Appears normal  Thoracic:              Appears normal         Spine:                  Appears normal  Heart:                 Appears normal         Upper Extremities:      Appears normal                         (  4CH, axis, and                         situs)  RVOT:                  Appears normal         Lower Extremities:      Appears normal  Other:  Nasal bone, lenses, 3VV, 3VT and VC visualized. Female gender          previously seen. Technically difficult due to maternal habitus. ---------------------------------------------------------------------- Cervix Uterus Adnexa  Cervix  Length:           3.53  cm.  Normal appearance by transabdominal scan.  Uterus  No abnormality visualized.  Right Ovary  Within normal limits.  Left Ovary  Within normal limits.  Cul De Sac  No free fluid seen.  Adnexa  No abnormality visualized. ---------------------------------------------------------------------- Impression  Patient returned for completion of fetal anatomy .Amniotic  fluid is normal and good fetal activity is seen .Fetal biometry  is consistent with her previously-established dates .Fetal  anatomical survey was completed (except ACI and limited  views of hands and feet) and appears normal. ---------------------------------------------------------------------- Recommendations  -An appointment was made for her to return in 8 weeks for  fetal growth assessment. ----------------------------------------------------------------------                  Noralee Space, MD Electronically Signed Final  Report   05/31/2021 02:24 pm ----------------------------------------------------------------------   Assessment and Plan:  Pregnancy: G2P0010 at [redacted]w[redacted]d 1. [redacted] weeks gestation of pregnancy 2. Supervision of high risk pregnancy, antepartum No concerns. Folow up with MFM as scheduled.  Preterm labor symptoms and general obstetric precautions including but not limited to vaginal bleeding, contractions, leaking of fluid and fetal movement were reviewed in detail with the patient. Please refer to After Visit Summary for other counseling recommendations.   Return in about 4 weeks (around 07/05/2021) for 2 hr GTT, 3rd trimester labs, TDap, OFFICE OB VISIT (MD only).  Future Appointments  Date Time Provider Department Center  07/26/2021  7:45 AM [facility] NURSE [facility]  07/26/2021  8:00 AM [facility] US1 [facility]    Jaynie Collins, MD

## 2021-06-08 ENCOUNTER — Inpatient Hospital Stay (HOSPITAL_COMMUNITY)
Admission: AD | Admit: 2021-06-08 | Discharge: 2021-06-08 | Disposition: A | Discharge status: Home / Self Care | Payer: BC Managed Care – PPO | Attending: Obstetrics and Gynecology | Admitting: Obstetrics and Gynecology

## 2021-06-08 ENCOUNTER — Encounter (HOSPITAL_COMMUNITY): Payer: Self-pay | Admitting: Obstetrics and Gynecology

## 2021-06-08 ENCOUNTER — Other Ambulatory Visit: Payer: Self-pay

## 2021-06-08 DIAGNOSIS — Z3A24 24 weeks gestation of pregnancy: Secondary | ICD-10-CM | POA: Diagnosis not present

## 2021-06-08 DIAGNOSIS — O4692 Antepartum hemorrhage, unspecified, second trimester: Secondary | ICD-10-CM | POA: Diagnosis not present

## 2021-06-08 DIAGNOSIS — O99891 Other specified diseases and conditions complicating pregnancy: Secondary | ICD-10-CM | POA: Diagnosis not present

## 2021-06-08 DIAGNOSIS — O23592 Infection of other part of genital tract in pregnancy, second trimester: Secondary | ICD-10-CM | POA: Insufficient documentation

## 2021-06-08 DIAGNOSIS — O0990 Supervision of high risk pregnancy, unspecified, unspecified trimester: Secondary | ICD-10-CM

## 2021-06-08 DIAGNOSIS — O99322 Drug use complicating pregnancy, second trimester: Secondary | ICD-10-CM

## 2021-06-08 DIAGNOSIS — N760 Acute vaginitis: Secondary | ICD-10-CM | POA: Diagnosis not present

## 2021-06-08 DIAGNOSIS — O26852 Spotting complicating pregnancy, second trimester: Secondary | ICD-10-CM

## 2021-06-08 DIAGNOSIS — Z7982 Long term (current) use of aspirin: Secondary | ICD-10-CM | POA: Diagnosis not present

## 2021-06-08 DIAGNOSIS — Z3689 Encounter for other specified antenatal screening: Secondary | ICD-10-CM

## 2021-06-08 DIAGNOSIS — B9689 Other specified bacterial agents as the cause of diseases classified elsewhere: Secondary | ICD-10-CM | POA: Insufficient documentation

## 2021-06-08 LAB — WET PREP, GENITAL
Sperm: NONE SEEN
Trich, Wet Prep: NONE SEEN
Yeast Wet Prep HPF POC: NONE SEEN

## 2021-06-08 LAB — URINALYSIS, ROUTINE W REFLEX MICROSCOPIC
Bilirubin Urine: NEGATIVE
Glucose, UA: NEGATIVE mg/dL
Hgb urine dipstick: NEGATIVE
Ketones, ur: NEGATIVE mg/dL
Leukocytes,Ua: NEGATIVE
Nitrite: NEGATIVE
Protein, ur: NEGATIVE mg/dL
Specific Gravity, Urine: 1.012 (ref 1.005–1.030)
pH: 8 (ref 5.0–8.0)

## 2021-06-08 MED ORDER — METRONIDAZOLE 0.75 % VA GEL: 1.0000 | Freq: Every day | VAGINAL | 0 refills | Status: DC

## 2021-06-08 NOTE — MAU Provider Note (Signed)
History     CSN: [id number]  Arrival date and time: 06/08/21 1042   Event Date/Time   First Provider Initiated Contact with Patient 06/08/21 1207      Chief Complaint  Patient presents with   Abdominal Pain   Vaginal Bleeding   Amy Singh is a 20 y.o. G2P0010 at [redacted]w[redacted]d who receives care at [facility].  She presents today for Abdominal Pain and Vaginal Bleeding.  She reports around 0935 she went to the bathroom and noted some bleeding with wiping.  She states it was "reddish color" and did not have clots.  She states she did not have any cramping, but having some "stomach pain" which she contributes to baby "balling up in one spot."  Patient states she is not having any current bleeding.  She reports sexual activity on Monday and denies pain or discomfort.   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   Qualifier: Diagnosis of  By: Barbaraann Barthel MD, [place]      Past Surgical History:  Procedure Laterality Date   WISDOM TOOTH EXTRACTION      Family History  Problem Relation Age of Onset   Diabetes Paternal Grandmother     Social History   Tobacco Use   Smoking status: Never   Smokeless tobacco: Never  Vaping Use   Vaping Use: Never used  Substance Use Topics   Alcohol use: No   Drug use: Yes    Types: Marijuana    Comment: 05/14/2021    Allergies: No Known Allergies  Medications Prior to Admission  Medication Sig Dispense Refill Last Dose   aspirin EC 81 MG tablet Take 1 tablet (81 mg total) by mouth daily. 60 tablet 2 06/07/2021   Prenatal Vit-Fe Fumarate-FA (PRENATAL MULTIVITAMIN) TABS tablet Take 1 tablet by mouth daily at 12 noon.   06/08/2021   Doxylamine-Pyridoxine (DICLEGIS) 10-10 MG TBEC Take 2  tablets by mouth at bedtime. If symptoms persist, add one tablet in the morning and one in the afternoon (Patient not taking: No sig reported) 100 tablet 5    promethazine (PHENERGAN) 25 MG tablet Take 1 tablet (25 mg total) by mouth every 6 (six) hours as needed for nausea or vomiting. (Patient not taking: Reported on 05/03/2021) 30 tablet 2     Review of Systems  Constitutional:  Negative for chills and fever.  Gastrointestinal:  Positive for abdominal pain (Cramping-None currently). Negative for constipation, diarrhea, nausea and vomiting.  Genitourinary:  Positive for vaginal bleeding (None currently). Negative for difficulty urinating, dysuria, pelvic pain and vaginal discharge.  Physical Exam   Blood pressure 116/69, pulse 83, temperature 98.4 F (36.9 C), temperature source Oral, resp. rate 16, height 5' 2.5" (1.588 m), weight 97.3 kg, last menstrual period 12/21/2020, SpO2 99 %, unknown if currently breastfeeding.  Physical Exam Vitals reviewed.  Constitutional:      Appearance: She is well-developed.  Cardiovascular:     Rate and  Rhythm: Normal rate and regular rhythm.  Pulmonary:     Effort: Pulmonary effort is normal.     Breath sounds: Normal breath sounds.  Abdominal:     Tenderness: There is no abdominal tenderness.  Genitourinary:    Vagina: Vaginal discharge present.     Comments: Speculum Exam: -Normal External Genitalia: Non tender, no apparent discharge at introitus.  -Vaginal Vault: Pink mucosa with good rugae. Frothy white discharge -wet prep collected -Cervix:Pink, no lesions, cysts, or polyps.  Appears closed. No active bleeding from os.  -Bimanual Exam:  No CMT. Uterine size c/w dates.  Skin:    General: Skin is warm and dry.  Neurological:     Mental Status: She is alert and oriented to person, place, and time.  Psychiatric:        Mood and Affect: Mood normal.        Behavior: Behavior normal.    Fetal Assessment 150 bpm, Mod Var, -Decels,  +Accels Toco: None Graphed  MAU Course   Results for orders placed or performed during the hospital encounter of 06/08/21 (from the past 24 hour(s))  Wet prep, genital     Status: Abnormal   Collection Time: 06/08/21 12:18 PM   Specimen: Cervix  Result Value Ref Range   Yeast Wet Prep HPF POC NONE SEEN NONE SEEN   Trich, Wet Prep NONE SEEN NONE SEEN   Clue Cells Wet Prep HPF POC PRESENT (A) NONE SEEN   WBC, Wet Prep HPF POC MODERATE (A) NONE SEEN   Sperm NONE SEEN    No results found.  MDM PE Labs: Wet Prep EFM  Assessment and Plan  20 year old G2P0010  SIUP at 24.1 weeks Cat I FT Vaginal Bleeding-Resolved  -POC Reviewed -Exam performed and findings discussed. -Informed that discharge c/w continued BV. -Will send wet prep. -Discussed treatment of BV.  -Educated on BV including what it is and how we treat. -Discussed recent US and no findings suggestive of placental issues. -Scheduled for follow up US. -Discussed pelvic rest for at least 3 days after cessation of bleeding. -Will await results  Cherre Robins MSN, CNM 06/08/2021, 12:07 PM   Reassessment (1:12 PM)  -Wet prep returns c/w BV. -Script for Metrogel sent to pharmacy on file.  -Patient to follow up as scheduled. -Encouraged to call primary office or return to MAU if symptoms worsen or with the onset of new symptoms. -Discharged to home in stable condition.  Cherre Robins MSN, CNM Advanced Practice Provider, [facility] [employer]

## 2021-06-08 NOTE — MAU Note (Signed)
Amy Singh is a 20 y.o. at [redacted]w[redacted]d here in MAU reporting: started spotting around 0930, states she saw the bleeding on the toilet paper in the bathroom this morning. Having some stomach pain but unsure if this is due to not having eaten yet. +FM. No LOF. Last IC was day before yesterday.  Onset of complaint: today  Pain score: 3/10  Vitals:   06/08/21 1101  BP: 131/67  Pulse: 92  Resp: 16  Temp: 98.4 F (36.9 C)  SpO2: 99%     FHT:148  Lab orders placed from triage: UA

## 2021-07-01 ENCOUNTER — Encounter: Payer: Self-pay | Admitting: Radiology

## 2021-07-07 ENCOUNTER — Ambulatory Visit (INDEPENDENT_AMBULATORY_CARE_PROVIDER_SITE_OTHER): Payer: BC Managed Care – PPO | Admitting: Obstetrics & Gynecology

## 2021-07-07 ENCOUNTER — Encounter: Payer: Self-pay | Admitting: Obstetrics & Gynecology

## 2021-07-07 ENCOUNTER — Other Ambulatory Visit: Payer: Self-pay

## 2021-07-07 ENCOUNTER — Other Ambulatory Visit: Payer: BC Managed Care – PPO

## 2021-07-07 VITALS — BP 121/78 | HR 82 | Wt 213.0 lb

## 2021-07-07 DIAGNOSIS — O0990 Supervision of high risk pregnancy, unspecified, unspecified trimester: Secondary | ICD-10-CM | POA: Diagnosis not present

## 2021-07-07 DIAGNOSIS — Z23 Encounter for immunization: Secondary | ICD-10-CM | POA: Diagnosis not present

## 2021-07-07 DIAGNOSIS — Z3A28 28 weeks gestation of pregnancy: Secondary | ICD-10-CM

## 2021-07-07 DIAGNOSIS — O99013 Anemia complicating pregnancy, third trimester: Secondary | ICD-10-CM

## 2021-07-07 NOTE — Progress Notes (Signed)
   PRENATAL VISIT NOTE  Subjective:  Amy Singh is a 20 y.o. G2P0010 at [redacted]w[redacted]d being seen today for ongoing prenatal care.  She is currently monitored for the following issues for this high-risk pregnancy and has History of marijuana use; IUFD at less than 20 weeks of gestation; Supervision of high risk pregnancy, antepartum; Gonorrhea affecting pregnancy; Chlamydia infection affecting pregnancy; Alpha thalassemia silent carrier; and Drug use affecting pregnancy on their problem list.  Patient reports no complaints.  Contractions: Not present. Vag. Bleeding: None.  Movement: Present. Denies leaking of fluid.   The following portions of the patient's history were reviewed and updated as appropriate: allergies, current medications, past family history, past medical history, past social history, past surgical history and problem list.   Objective:   Vitals:   07/07/21 1000  BP: 121/78  Pulse: 82  Weight: 213 lb (96.6 kg)    Fetal Status: Fetal Heart Rate (bpm): 156 Fundal Height: 28 cm Movement: Present     General:  Alert, oriented and cooperative. Patient is in no acute distress.  Skin: Skin is warm and dry. No rash noted.   Cardiovascular: Normal heart rate noted  Respiratory: Normal respiratory effort, no problems with respiration noted  Abdomen: Soft, gravid, appropriate for gestational age.  Pain/Pressure: Present     Pelvic: Cervical exam deferred        Extremities: Normal range of motion.  Edema: Trace  Mental Status: Normal mood and affect. Normal behavior. Normal judgment and thought content.   Assessment and Plan:  Pregnancy: G2P0010 at [redacted]w[redacted]d 1. Flu vaccine need - Flu Vaccine QUAD 71mo+IM (Fluarix, Fluzone & Alfiuria Quad PF)  2. Need for Tdap vaccination - Tdap vaccine greater than or equal to 7yo IM  3. [redacted] weeks gestation of pregnancy 4. Supervision of high risk pregnancy, antepartum Third trimester labs done today, will follow up results and manage  accordingly. Preterm labor symptoms and general obstetric precautions including but not limited to vaginal bleeding, contractions, leaking of fluid and fetal movement were reviewed in detail with the patient. Please refer to After Visit Summary for other counseling recommendations.   Return in about 2 weeks (around 07/21/2021) for OFFICE OB VISIT (MD or APP).  Future Appointments  Date Time Provider Department Center  07/19/2021 10:55 AM Constant, Gigi Gin, MD [facility]  07/26/2021  7:45 AM [facility] NURSE [facility]  07/26/2021  8:00 AM [facility] US1 [facility]  08/04/2021  8:10 AM Calvert Cantor, CNM [facility]  08/17/2021  1:10 PM Reva Bores, MD [facility]  08/31/2021  8:55 AM [place] Large, Jethro Bastos, MD [facility]  09/06/2021  8:55 AM [place] Bing, MD [facility]  09/13/2021  8:55 AM Niquan Charnley, Jethro Bastos, MD [facility]  09/20/2021  8:55 AM [place] Bing, MD [facility]    Jaynie Collins, MD

## 2021-07-07 NOTE — Patient Instructions (Signed)
Return to office for any scheduled appointments. Call the office or go to the MAU at [facility] if: You begin to have strong, frequent contractions Your water breaks.  Sometimes it is a big gush of fluid, sometimes it is just a trickle that keeps getting your panties wet or running down your legs You have vaginal bleeding.  It is normal to have a small amount of spotting if your cervix was checked.  You do not feel your baby moving like normal.  If you do not, get something to eat and drink and lay down and focus on feeling your baby move.   If your baby is still not moving like normal, you should call the office or go to MAU. Any other obstetric concerns.  Third Trimester of Pregnancy The third trimester of pregnancy is from week 28 through week 40. This is months 7 through 9. The third trimester is a time when the unborn baby (fetus) is growing rapidly. At the end of the ninth month, the fetus is about 20 inches long and weighs 6-10 pounds. Body changes during your third trimester During the third trimester, your body will continue to go through many changes. The changes vary and generally return to normal after your baby is born. Physical changes Your weight will continue to increase. You can expect to gain 25-35 pounds (11-16 kg) by the end of the pregnancy if you begin pregnancy at a normal weight. If you are underweight, you can expect to gain 28-40 lb (about 13-18 kg), and if you are overweight, you can expect to gain 15-25 lb (about 7-11 kg). You may begin to get stretch marks on your hips, abdomen, and breasts. Your breasts will continue to grow and may hurt. A yellow fluid (colostrum) may leak from your breasts. This is the first milk you are producing for your baby. You may have changes in your hair. These can include thickening of your hair, rapid growth, and changes in texture. Some people also have hair loss during or after pregnancy, or hair that feels dry  or thin. Your belly button may stick out. You may notice more swelling in your hands, face, or ankles. Health changes You may have heartburn. You may have constipation. You may develop hemorrhoids. You may develop swollen, bulging veins (varicose veins) in your legs. You may have increased body aches in the pelvis, back, or thighs. This is due to weight gain and increased hormones that are relaxing your joints. You may have increased tingling or numbness in your hands, arms, and legs. The skin on your abdomen may also feel numb. You may feel short of breath because of your expanding uterus. Other changes You may urinate more often because the fetus is moving lower into your pelvis and pressing on your bladder. You may have more problems sleeping. This may be caused by the size of your abdomen, an increased need to urinate, and an increase in your body's metabolism. You may notice the fetus "dropping," or moving lower in your abdomen (lightening). You may have increased vaginal discharge. You may notice that you have pain around your pelvic bone as your uterus distends. Follow these instructions at home: Medicines Follow your health care provider's instructions regarding medicine use. Specific medicines may be either safe or unsafe to take during pregnancy. Do not take any medicines unless approved by your health care provider. Take a prenatal vitamin that contains at least 600 micrograms (mcg) of folic acid. Eating   and drinking Eat a healthy diet that includes fresh fruits and vegetables, whole grains, good sources of protein such as meat, eggs, or tofu, and low-fat dairy products. Avoid raw meat and unpasteurized juice, milk, and cheese. These carry germs that can harm you and your baby. Eat 4 or 5 small meals rather than 3 large meals a day. You may need to take these actions to prevent or treat constipation: Drink enough fluid to keep your urine pale yellow. Eat foods that are high in  fiber, such as beans, whole grains, and fresh fruits and vegetables. Limit foods that are high in fat and processed sugars, such as fried or sweet foods. Activity Exercise only as directed by your health care provider. Most people can continue their usual exercise routine during pregnancy. Try to exercise for 30 minutes at least 5 days a week. Stop exercising if you experience contractions in the uterus. Stop exercising if you develop pain or cramping in the lower abdomen or lower back. Avoid heavy lifting. Do not exercise if it is very hot or humid or if you are at a high altitude. If you choose to, you may continue to have sex unless your health care provider tells you not to. Relieving pain and discomfort Take frequent breaks and rest with your legs raised (elevated) if you have leg cramps or low back pain. Take warm sitz baths to soothe any pain or discomfort caused by hemorrhoids. Use hemorrhoid cream if your health care provider approves. Wear a supportive bra to prevent discomfort from breast tenderness. If you develop varicose veins: Wear support hose as told by your health care provider. Elevate your feet for 15 minutes, 3-4 times a day. Limit salt in your diet. Safety Talk to your health care provider before traveling far distances. Do not use hot tubs, steam rooms, or saunas. Wear your seat belt at all times when driving or riding in a car. Talk with your health care provider if someone is verbally or physically abusive to you. Preparing for birth To prepare for the arrival of your baby: Take prenatal classes to understand, practice, and ask questions about labor and delivery. Visit the hospital and tour the maternity area. Purchase a rear-facing car seat and make sure you know how to install it in your car. Prepare the baby's room or sleeping area. Make sure to remove all pillows and stuffed animals from the baby's crib to prevent suffocation. General instructions Avoid cat  litter boxes and soil used by cats. These carry germs that can cause birth defects in the baby. If you have a cat, ask someone to clean the litter box for you. Do not douche or use tampons. Do not use scented sanitary pads. Do not use any products that contain nicotine or tobacco, such as cigarettes, e-cigarettes, and chewing tobacco. If you need help quitting, ask your health care provider. Do not use any herbal remedies, illegal drugs, or medicines that were not prescribed to you. Chemicals in these products can harm your baby. Do not drink alcohol. You will have more frequent prenatal exams during the third trimester. During a routine prenatal visit, your health care provider will do a physical exam, perform tests, and discuss your overall health. Keep all follow-up visits. This is important. Where to find more information American Pregnancy Association: americanpregnancy.org American College of Obstetricians and Gynecologists: acog.org/en/Womens%20Health/Pregnancy Office on Women's Health: womenshealth.gov/pregnancy Contact a health care provider if you have: A fever. Mild pelvic cramps, pelvic pressure, or nagging pain in   your abdominal area or lower back. Vomiting or diarrhea. Bad-smelling vaginal discharge or foul-smelling urine. Pain when you urinate. A headache that does not go away when you take medicine. Visual changes or see spots in front of your eyes. Get help right away if: Your water breaks. You have regular contractions less than 5 minutes apart. You have spotting or bleeding from your vagina. You have severe abdominal pain. You have difficulty breathing. You have chest pain. You have fainting spells. You have not felt your baby move for the time period told by your health care provider. You have new or increased pain, swelling, or redness in an arm or leg. Summary The third trimester of pregnancy is from week 28 through week 40 (months 7 through 9). You may have more  problems sleeping. This can be caused by the size of your abdomen, an increased need to urinate, and an increase in your body's metabolism. You will have more frequent prenatal exams during the third trimester. Keep all follow-up visits. This is important. This information is not intended to replace advice given to you by your health care provider. Make sure you discuss any questions you have with your health care provider. Document Revised: 02/18/2020 Document Reviewed: 12/25/2019 Elsevier Patient Education  2022 Elsevier Inc.  

## 2021-07-08 DIAGNOSIS — O99013 Anemia complicating pregnancy, third trimester: Secondary | ICD-10-CM | POA: Insufficient documentation

## 2021-07-08 LAB — CBC
Hematocrit: 30.4 % — ABNORMAL LOW (ref 34.0–46.6)
Hemoglobin: 10.2 g/dL — ABNORMAL LOW (ref 11.1–15.9)
MCH: 27.6 pg (ref 26.6–33.0)
MCHC: 33.6 g/dL (ref 31.5–35.7)
MCV: 82 fL (ref 79–97)
Platelets: 217 10*3/uL (ref 150–450)
RBC: 3.69 x10E6/uL — ABNORMAL LOW (ref 3.77–5.28)
RDW: 13.7 % (ref 11.7–15.4)
WBC: 10.4 10*3/uL (ref 3.4–10.8)

## 2021-07-08 LAB — HIV ANTIBODY (ROUTINE TESTING W REFLEX): HIV Screen 4th Generation wRfx: NONREACTIVE

## 2021-07-08 LAB — GLUCOSE TOLERANCE, 2 HOURS W/ 1HR
Glucose, 1 hour: 123 mg/dL (ref 70–179)
Glucose, 2 hour: 105 mg/dL (ref 70–152)
Glucose, Fasting: 71 mg/dL (ref 70–91)

## 2021-07-08 LAB — RPR: RPR Ser Ql: NONREACTIVE

## 2021-07-08 MED ORDER — FERROUS SULFATE 325 (65 FE) MG PO TABS: 325.0000 mg | ORAL_TABLET | ORAL | 3 refills | Status: DC

## 2021-07-19 ENCOUNTER — Ambulatory Visit (INDEPENDENT_AMBULATORY_CARE_PROVIDER_SITE_OTHER): Payer: BC Managed Care – PPO | Admitting: Obstetrics and Gynecology

## 2021-07-19 ENCOUNTER — Other Ambulatory Visit: Payer: Self-pay

## 2021-07-19 ENCOUNTER — Encounter: Payer: Self-pay | Admitting: Obstetrics and Gynecology

## 2021-07-19 VITALS — BP 118/73 | HR 77 | Wt 216.0 lb

## 2021-07-19 DIAGNOSIS — O0990 Supervision of high risk pregnancy, unspecified, unspecified trimester: Secondary | ICD-10-CM

## 2021-07-19 DIAGNOSIS — O99013 Anemia complicating pregnancy, third trimester: Secondary | ICD-10-CM

## 2021-07-19 DIAGNOSIS — O99210 Obesity complicating pregnancy, unspecified trimester: Secondary | ICD-10-CM

## 2021-07-19 NOTE — Progress Notes (Signed)
   PRENATAL VISIT NOTE  Subjective:  Amy Singh is a 20 y.o. G2P0010 at [redacted]w[redacted]d being seen today for ongoing prenatal care.  She is currently monitored for the following issues for this high-risk pregnancy and has History of marijuana use; IUFD at less than 20 weeks of gestation; Supervision of high risk pregnancy, antepartum; Gonorrhea affecting pregnancy; Chlamydia infection affecting pregnancy; Alpha thalassemia silent carrier; Drug use affecting pregnancy; Anemia in pregnancy, third trimester; and Maternal obesity affecting pregnancy, antepartum on their problem list.  Patient reports no complaints.  Contractions: Not present. Vag. Bleeding: None.  Movement: Present. Denies leaking of fluid.   The following portions of the patient's history were reviewed and updated as appropriate: allergies, current medications, past family history, past medical history, past social history, past surgical history and problem list.   Objective:   Vitals:   07/19/21 1105  BP: 118/73  Pulse: 77  Weight: 216 lb (98 kg)    Fetal Status: Fetal Heart Rate (bpm): 159 Fundal Height: 30 cm Movement: Present     General:  Alert, oriented and cooperative. Patient is in no acute distress.  Skin: Skin is warm and dry. No rash noted.   Cardiovascular: Normal heart rate noted  Respiratory: Normal respiratory effort, no problems with respiration noted  Abdomen: Soft, gravid, appropriate for gestational age.  Pain/Pressure: Absent     Pelvic: Cervical exam deferred        Extremities: Normal range of motion.  Edema: Trace  Mental Status: Normal mood and affect. Normal behavior. Normal judgment and thought content.   Assessment and Plan:  Pregnancy: G2P0010 at [redacted]w[redacted]d 1. Supervision of high risk pregnancy, antepartum Patient is doing well without complaints  2. Maternal obesity affecting pregnancy, antepartum Continue ASA Follow up growth 11/1  3. Anemia in pregnancy, third trimester Patient has not yet  started iron supplement but plans to pick up today  Preterm labor symptoms and general obstetric precautions including but not limited to vaginal bleeding, contractions, leaking of fluid and fetal movement were reviewed in detail with the patient. Please refer to After Visit Summary for other counseling recommendations.   Return in about 2 weeks (around 08/02/2021) for in person, ROB, Low risk.  Future Appointments  Date Time Provider Department Center  07/26/2021  7:45 AM [facility] NURSE [facility]  07/26/2021  8:00 AM [facility] US1 [facility]  08/04/2021  8:10 AM Calvert Cantor, CNM [facility]  08/17/2021  1:10 PM Reva Bores, MD [facility]  08/31/2021  8:55 AM [place] Large, Jethro Bastos, MD [facility]  09/06/2021  8:55 AM [place] Bing, MD [facility]  09/13/2021  8:55 AM [place] Large, Jethro Bastos, MD [facility]  09/20/2021  8:55 AM [place] Bing, MD [facility]    Catalina Antigua, MD

## 2021-07-19 NOTE — Progress Notes (Signed)
ROB 30w  CC:

## 2021-07-26 ENCOUNTER — Ambulatory Visit: Payer: BC Managed Care – PPO | Admitting: *Deleted

## 2021-07-26 ENCOUNTER — Encounter: Payer: Self-pay | Admitting: *Deleted

## 2021-07-26 ENCOUNTER — Ambulatory Visit: Payer: BC Managed Care – PPO | Attending: Obstetrics and Gynecology

## 2021-07-26 ENCOUNTER — Other Ambulatory Visit: Payer: Self-pay

## 2021-07-26 VITALS — BP 145/72 | HR 93

## 2021-07-26 DIAGNOSIS — Z8759 Personal history of other complications of pregnancy, childbirth and the puerperium: Secondary | ICD-10-CM | POA: Diagnosis not present

## 2021-07-26 DIAGNOSIS — Z3A31 31 weeks gestation of pregnancy: Secondary | ICD-10-CM

## 2021-07-26 DIAGNOSIS — O99323 Drug use complicating pregnancy, third trimester: Secondary | ICD-10-CM | POA: Insufficient documentation

## 2021-07-26 DIAGNOSIS — O99213 Obesity complicating pregnancy, third trimester: Secondary | ICD-10-CM

## 2021-07-26 DIAGNOSIS — O99210 Obesity complicating pregnancy, unspecified trimester: Secondary | ICD-10-CM

## 2021-07-26 DIAGNOSIS — O09293 Supervision of pregnancy with other poor reproductive or obstetric history, third trimester: Secondary | ICD-10-CM | POA: Diagnosis not present

## 2021-07-26 DIAGNOSIS — O0990 Supervision of high risk pregnancy, unspecified, unspecified trimester: Secondary | ICD-10-CM | POA: Insufficient documentation

## 2021-07-27 ENCOUNTER — Encounter (HOSPITAL_COMMUNITY): Payer: Self-pay | Admitting: Obstetrics & Gynecology

## 2021-07-27 ENCOUNTER — Inpatient Hospital Stay (HOSPITAL_COMMUNITY)
Admission: AD | Admit: 2021-07-27 | Discharge: 2021-07-27 | Disposition: A | Discharge status: Home / Self Care | Payer: BC Managed Care – PPO | Attending: Obstetrics & Gynecology | Admitting: Obstetrics & Gynecology

## 2021-07-27 ENCOUNTER — Other Ambulatory Visit: Payer: Self-pay

## 2021-07-27 DIAGNOSIS — F1221 Cannabis dependence, in remission: Secondary | ICD-10-CM | POA: Diagnosis not present

## 2021-07-27 DIAGNOSIS — Z7982 Long term (current) use of aspirin: Secondary | ICD-10-CM | POA: Diagnosis not present

## 2021-07-27 DIAGNOSIS — Z3A31 31 weeks gestation of pregnancy: Secondary | ICD-10-CM | POA: Insufficient documentation

## 2021-07-27 DIAGNOSIS — N898 Other specified noninflammatory disorders of vagina: Secondary | ICD-10-CM | POA: Diagnosis present

## 2021-07-27 DIAGNOSIS — O4690 Antepartum hemorrhage, unspecified, unspecified trimester: Secondary | ICD-10-CM | POA: Diagnosis present

## 2021-07-27 DIAGNOSIS — O4693 Antepartum hemorrhage, unspecified, third trimester: Secondary | ICD-10-CM | POA: Diagnosis not present

## 2021-07-27 DIAGNOSIS — O99323 Drug use complicating pregnancy, third trimester: Secondary | ICD-10-CM | POA: Diagnosis not present

## 2021-07-27 DIAGNOSIS — O26893 Other specified pregnancy related conditions, third trimester: Secondary | ICD-10-CM

## 2021-07-27 DIAGNOSIS — F1291 Cannabis use, unspecified, in remission: Secondary | ICD-10-CM

## 2021-07-27 NOTE — MAU Note (Signed)
Amy Singh is a 20 y.o. at [redacted]w[redacted]d here in MAU reporting: this AM she lost her mucus plug and then saw a little bit of bleeding into the toilet after. Denies pain. No LOF. States last IC was yesterday. +FM  Onset of complaint: today  Pain score: 0/10  Vitals:   07/27/21 1117  BP: 129/74  Pulse: (!) 110  Resp: 16  Temp: 98.4 F (36.9 C)  SpO2: 99%     FHT: EFM applied in room  Lab orders placed from triage: none

## 2021-07-27 NOTE — MAU Provider Note (Signed)
History     CSN: [id number]  Arrival date and time: 07/27/21 1055   Event Date/Time   First Provider Initiated Contact with Patient 07/27/21 1145      Chief Complaint  Patient presents with   Vaginal Bleeding   HPI  [name] is a 20 y.o. female G2P0010 @ [redacted]w[redacted]d here in MAU with concerns of passing her mucus plug. She noticed that this morning. She noticed like specks of blood in her discharge. She reports intercourse yesterday x 2. No LOF, + Fetal movement.   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   Qualifier: Diagnosis of  By: Barbaraann Barthel MD, [place]      Past Surgical History:  Procedure Laterality Date   WISDOM TOOTH EXTRACTION      Family History  Problem Relation Age of Onset   Diabetes Paternal Grandmother     Social History   Tobacco Use   Smoking status: Never   Smokeless tobacco: Never  Vaping Use   Vaping Use: Never used  Substance Use Topics   Alcohol use: No   Drug use: Yes    Types: Marijuana    Comment: 05/14/2021    Allergies: No Known Allergies  Medications Prior to Admission  Medication Sig Dispense Refill Last Dose   aspirin EC 81 MG tablet Take 1 tablet (81 mg total) by mouth daily. 60 tablet 2 Past Week   Prenatal Vit-Fe Fumarate-FA (PRENATAL MULTIVITAMIN) TABS tablet Take 1 tablet by mouth daily at 12 noon.   07/26/2021   ferrous sulfate (FERROUSUL) 325 (65 FE) MG tablet Take 1 tablet (325 mg total) by mouth every other day. (Patient not taking: Reported on 07/26/2021) 30 tablet 3    metroNIDAZOLE (METROGEL VAGINAL) 0.75 % vaginal gel Place 1 Applicatorful vaginally at bedtime. Insert one applicator, at bedtime, for 5 nights. (Patient not taking: No sig reported) 70 g 0     No results found for this or any previous visit (from the past 48 hour(s)).   Review of Systems  Gastrointestinal:  Negative for abdominal pain.  Genitourinary:  Positive for vaginal discharge. Negative for vaginal bleeding.  Physical Exam   Blood pressure 120/67, pulse 92, temperature 98.4 F (36.9 C), temperature source Oral, resp. rate 16, height 5' 3.5" (1.613 m), weight 97.4 kg, last menstrual period 12/21/2020, SpO2 99 %, unknown if currently breastfeeding.  Physical Exam Vitals and nursing note reviewed.  Constitutional:      General: She is not in acute distress.    Appearance: Normal appearance. She is not ill-appearing, toxic-appearing or diaphoretic.  HENT:     Head: Normocephalic.  Genitourinary:    Comments: Vagina - Small amount of mucoid discharge.  Bimanual exam: Cervix closed, thick, posterior.  Chaperone present for exam.   Musculoskeletal:        General: Normal range of motion.  Skin:    General: Skin is warm.  Neurological:  Mental Status: She is alert and oriented to person, place, and time.  Psychiatric:        Behavior: Behavior normal.   Fetal Tracing: Baseline: 125 bpm Variability: Moderate  Accelerations: 15x15 Decelerations: None Toco: none  MAU Course  Procedures None  MDM   Assessment and Plan   [redacted] weeks gestation of pregnancy - Plan: Discharge patient  History of marijuana use  Vaginal bleeding in pregnancy    P:  Discharge home in stable condition Return to MAU if symptoms worsen Preterm labor precautions   Kitrina Maurin, Harolyn Rutherford, NP 07/29/2021 11:52 AM

## 2021-07-29 DIAGNOSIS — N898 Other specified noninflammatory disorders of vagina: Secondary | ICD-10-CM | POA: Diagnosis present

## 2021-07-29 DIAGNOSIS — O26893 Other specified pregnancy related conditions, third trimester: Secondary | ICD-10-CM | POA: Diagnosis present

## 2021-07-29 DIAGNOSIS — O4690 Antepartum hemorrhage, unspecified, unspecified trimester: Secondary | ICD-10-CM | POA: Diagnosis present

## 2021-07-29 DIAGNOSIS — Z3A31 31 weeks gestation of pregnancy: Secondary | ICD-10-CM

## 2021-08-04 ENCOUNTER — Other Ambulatory Visit: Payer: Self-pay

## 2021-08-04 ENCOUNTER — Ambulatory Visit (INDEPENDENT_AMBULATORY_CARE_PROVIDER_SITE_OTHER): Payer: BC Managed Care – PPO | Admitting: Advanced Practice Midwife

## 2021-08-04 VITALS — BP 130/72 | HR 94 | Wt 221.0 lb

## 2021-08-04 DIAGNOSIS — R030 Elevated blood-pressure reading, without diagnosis of hypertension: Secondary | ICD-10-CM

## 2021-08-04 DIAGNOSIS — O99210 Obesity complicating pregnancy, unspecified trimester: Secondary | ICD-10-CM

## 2021-08-04 DIAGNOSIS — N930 Postcoital and contact bleeding: Secondary | ICD-10-CM

## 2021-08-04 DIAGNOSIS — O0990 Supervision of high risk pregnancy, unspecified, unspecified trimester: Secondary | ICD-10-CM

## 2021-08-04 DIAGNOSIS — Z3A32 32 weeks gestation of pregnancy: Secondary | ICD-10-CM

## 2021-08-04 DIAGNOSIS — O021 Missed abortion: Secondary | ICD-10-CM

## 2021-08-04 DIAGNOSIS — O26893 Other specified pregnancy related conditions, third trimester: Secondary | ICD-10-CM

## 2021-08-04 DIAGNOSIS — R102 Pelvic and perineal pain: Secondary | ICD-10-CM

## 2021-08-04 MED ORDER — CYCLOBENZAPRINE HCL 10 MG PO TABS: 10.0000 mg | ORAL_TABLET | Freq: Three times a day (TID) | ORAL | 2 refills | Status: DC | PRN

## 2021-08-04 NOTE — Progress Notes (Signed)
   PRENATAL VISIT NOTE  Subjective:  Amy Singh is a 20 y.o. G2P0010 at [redacted]w[redacted]d being seen today for ongoing prenatal care.  She is currently monitored for the following issues for this high-risk pregnancy and has History of marijuana use; IUFD at less than 20 weeks of gestation; Supervision of high risk pregnancy, antepartum; Gonorrhea affecting pregnancy; Chlamydia infection affecting pregnancy; Alpha thalassemia silent carrier; Drug use affecting pregnancy; Anemia in pregnancy, third trimester; Maternal obesity affecting pregnancy, antepartum; Vaginal discharge in pregnancy in third trimester; [redacted] weeks gestation of pregnancy; and Vaginal bleeding in pregnancy on their problem list.  Patient reports  recurrent pelvic pain . She endorses prolonged periods of walking and standing. No episodes of vaginal bleeding since MAU visit. Mild to moderate headaches which resolve with Tylenol. Denies visual disturbances. Contractions: Not present. Vag. Bleeding: None.  Movement: Present. Denies leaking of fluid.   The following portions of the patient's history were reviewed and updated as appropriate: allergies, current medications, past family history, past medical history, past social history, past surgical history and problem list. Problem list updated.  Objective:   Vitals:   08/04/21 0819  BP: 130/72  Pulse: 94  Weight: 221 lb (100.2 kg)    Fetal Status: Fetal Heart Rate (bpm): 145 Fundal Height: 32 cm Movement: Present     General:  Alert, oriented and cooperative. Patient is in no acute distress.  Skin: Skin is warm and dry. No rash noted.   Cardiovascular: Normal heart rate noted  Respiratory: Normal respiratory effort, no problems with respiration noted  Abdomen: Soft, gravid, appropriate for gestational age.  Pain/Pressure: Present     Pelvic: Cervical exam deferred        Extremities: Normal range of motion.  Edema: Trace  Mental Status: Normal mood and affect. Normal behavior. Normal  judgment and thought content.   Assessment and Plan:  Pregnancy: G2P0010 at [redacted]w[redacted]d  1. Supervision of high risk pregnancy, antepartum - No changes to current plan of care - Reviewed guidelines for daily kick counts, interventions for low kick number, indications for MAU evaluation  2. IUFD at less than 20 weeks of gestation   3. Postcoital bleeding - Resolved. No episodes since MAU visit 11/02  4. Pelvic pain in pregnancy, antepartum, third trimester - Will trial Flexeril to improve overall discomfort of physiologic changes - Discussed Flexeril is unlikely to improve pelvic pain specifically - Advised maternity belt worn low on hips, demonstrated positions to aid hip flexion  5. Elevated BP without diagnosis of hypertension - No hx HTN, normotensive today, continue to monitor - 145/72 in MAU 11/01, normal on recheck during same encounter  6. Maternal obesity affecting pregnancy, antepartum - FH appropriate - TWG 44 lbs  7. [redacted] weeks gestation of pregnancy   Preterm labor symptoms and general obstetric precautions including but not limited to vaginal bleeding, contractions, leaking of fluid and fetal movement were reviewed in detail with the patient. Please refer to After Visit Summary for other counseling recommendations.  Return in about 2 weeks (around 08/18/2021) for Any Provider.  Future Appointments  Date Time Provider Department Center  08/17/2021  1:10 PM Reva Bores, MD [facility]  08/31/2021  8:55 AM Macon Large, Jethro Bastos, MD [facility]  09/06/2021  8:55 AM Floris Bing, MD [facility]  09/13/2021  8:55 AM [place] Large, Jethro Bastos, MD [facility]  09/20/2021  8:55 AM [place] Bing, MD [facility]    Calvert Cantor, CNM

## 2021-08-08 ENCOUNTER — Telehealth: Payer: Self-pay

## 2021-08-08 NOTE — Telephone Encounter (Signed)
Joni Reining, RN (Case Manager with [employer]) called with update regarding patient. States she spoke with patient today who reported contractions every 4 minutes overnight and pelvic pain. Joni Reining states she gave preterm labor precautions and recommended that patient go to the hospital for evaluation if this should happen again.   Called pt to follow up; VM left stating I am calling to follow up with patient and encouraging her to call back or respond to MyChart message with any concerns.

## 2021-08-17 ENCOUNTER — Encounter: Payer: BC Managed Care – PPO | Admitting: Family Medicine

## 2021-08-24 ENCOUNTER — Encounter (HOSPITAL_COMMUNITY): Payer: Self-pay | Admitting: Obstetrics and Gynecology

## 2021-08-24 ENCOUNTER — Telehealth: Payer: Self-pay

## 2021-08-24 ENCOUNTER — Other Ambulatory Visit: Payer: Self-pay

## 2021-08-24 ENCOUNTER — Inpatient Hospital Stay (HOSPITAL_COMMUNITY)
Admission: AD | Admit: 2021-08-24 | Discharge: 2021-08-24 | Discharge status: Against Medical Advice | Payer: BC Managed Care – PPO | Attending: Obstetrics and Gynecology | Admitting: Obstetrics and Gynecology

## 2021-08-24 DIAGNOSIS — O219 Vomiting of pregnancy, unspecified: Secondary | ICD-10-CM | POA: Diagnosis not present

## 2021-08-24 DIAGNOSIS — Z9189 Other specified personal risk factors, not elsewhere classified: Secondary | ICD-10-CM

## 2021-08-24 DIAGNOSIS — R112 Nausea with vomiting, unspecified: Secondary | ICD-10-CM | POA: Diagnosis not present

## 2021-08-24 DIAGNOSIS — Z3A35 35 weeks gestation of pregnancy: Secondary | ICD-10-CM | POA: Diagnosis not present

## 2021-08-24 DIAGNOSIS — O212 Late vomiting of pregnancy: Secondary | ICD-10-CM | POA: Insufficient documentation

## 2021-08-24 LAB — URINALYSIS, ROUTINE W REFLEX MICROSCOPIC
Bilirubin Urine: NEGATIVE
Glucose, UA: NEGATIVE mg/dL
Ketones, ur: NEGATIVE mg/dL
Nitrite: NEGATIVE
Protein, ur: 30 mg/dL — AB
Specific Gravity, Urine: 1.01 (ref 1.005–1.030)
pH: 8 (ref 5.0–8.0)

## 2021-08-24 LAB — CBC WITH DIFFERENTIAL/PLATELET
Abs Immature Granulocytes: 0.09 10*3/uL — ABNORMAL HIGH (ref 0.00–0.07)
Basophils Absolute: 0 10*3/uL (ref 0.0–0.1)
Basophils Relative: 0 %
Eosinophils Absolute: 0.1 10*3/uL (ref 0.0–0.5)
Eosinophils Relative: 1 %
HCT: 28.3 % — ABNORMAL LOW (ref 36.0–46.0)
Hemoglobin: 8.9 g/dL — ABNORMAL LOW (ref 12.0–15.0)
Immature Granulocytes: 1 %
Lymphocytes Relative: 17 %
Lymphs Abs: 2.2 10*3/uL (ref 0.7–4.0)
MCH: 25.4 pg — ABNORMAL LOW (ref 26.0–34.0)
MCHC: 31.4 g/dL (ref 30.0–36.0)
MCV: 80.6 fL (ref 80.0–100.0)
Monocytes Absolute: 0.8 10*3/uL (ref 0.1–1.0)
Monocytes Relative: 7 %
Neutro Abs: 9.5 10*3/uL — ABNORMAL HIGH (ref 1.7–7.7)
Neutrophils Relative %: 74 %
Platelets: 231 10*3/uL (ref 150–400)
RBC: 3.51 MIL/uL — ABNORMAL LOW (ref 3.87–5.11)
RDW: 14 % (ref 11.5–15.5)
WBC: 12.7 10*3/uL — ABNORMAL HIGH (ref 4.0–10.5)
nRBC: 0.2 % (ref 0.0–0.2)

## 2021-08-24 LAB — COMPREHENSIVE METABOLIC PANEL
ALT: 13 U/L (ref 0–44)
AST: 21 U/L (ref 15–41)
Albumin: 2.4 g/dL — ABNORMAL LOW (ref 3.5–5.0)
Alkaline Phosphatase: 105 U/L (ref 38–126)
Anion gap: 7 (ref 5–15)
BUN: <5 mg/dL — ABNORMAL LOW (ref 6–20)
CO2: 25 mmol/L (ref 22–32)
Calcium: 8.3 mg/dL — ABNORMAL LOW (ref 8.9–10.3)
Chloride: 106 mmol/L (ref 98–111)
Creatinine, Ser: 0.46 mg/dL (ref 0.44–1.00)
GFR, Estimated: >60 mL/min (ref >60)
Glucose, Bld: 80 mg/dL (ref 70–99)
Potassium: 3 mmol/L — ABNORMAL LOW (ref 3.5–5.1)
Sodium: 138 mmol/L (ref 135–145)
Total Bilirubin: 0.5 mg/dL (ref 0.3–1.2)
Total Protein: 5.5 g/dL — ABNORMAL LOW (ref 6.5–8.1)

## 2021-08-24 LAB — URINALYSIS, MICROSCOPIC (REFLEX)

## 2021-08-24 MED ORDER — POTASSIUM CHLORIDE CRYS ER 20 MEQ PO TBCR: 40.0000 meq | EXTENDED_RELEASE_TABLET | Freq: Every day | ORAL | 0 refills | Status: DC

## 2021-08-24 MED ORDER — POTASSIUM CHLORIDE CRYS ER 20 MEQ PO TBCR
40.0000 meq | EXTENDED_RELEASE_TABLET | Freq: Once | ORAL | Status: AC
  Administered 2021-08-24: 40 meq via ORAL
  Filled 2021-08-24: qty 2

## 2021-08-24 MED ORDER — ONDANSETRON 4 MG PO TBDP
4.0000 mg | ORAL_TABLET | Freq: Once | ORAL | Status: AC
  Administered 2021-08-24: 4 mg via ORAL
  Filled 2021-08-24: qty 1

## 2021-08-24 MED ORDER — METOCLOPRAMIDE HCL 10 MG PO TABS
10.0000 mg | ORAL_TABLET | Freq: Once | ORAL | Status: AC
  Administered 2021-08-24: 10 mg via ORAL
  Filled 2021-08-24: qty 1

## 2021-08-24 MED ORDER — METOCLOPRAMIDE HCL 10 MG PO TABS: 10.0000 mg | ORAL_TABLET | Freq: Three times a day (TID) | ORAL | 0 refills | Status: DC

## 2021-08-24 NOTE — MAU Provider Note (Signed)
History     CSN: [id number]  Arrival date and time: 08/24/21 1120   Event Date/Time   First Provider Initiated Contact with Patient 08/24/21 1203      Chief Complaint  Patient presents with   Emesis   HPI  [name] is a 20 y.o. female G2P0010 @ [redacted]w[redacted]d  here in MAU with vomiting. She reports symptoms started shortly after Thanksgiving. The vomiting does not happen everyday; however when she attempts to eat she feels most things come back up. The vomiting is random. The last time she vomited was this morning at 0900. She last ate a meal at midnight last night. She ate tacos and fried mozzarella sticks. She has no pain. + fetal movement.   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   Qualifier: Diagnosis of  By: Barbaraann Barthel MD, [place]      Past Surgical History:  Procedure Laterality Date   WISDOM TOOTH EXTRACTION      Family History  Problem Relation Age of Onset   Diabetes Paternal Grandmother     Social History   Tobacco Use   Smoking status: Never   Smokeless tobacco: Never  Vaping Use   Vaping Use: Never used  Substance Use Topics   Alcohol use: No   Drug use: Yes    Types: Marijuana    Comment: 08/18/21    Allergies: No Known Allergies  Medications Prior to Admission  Medication Sig Dispense Refill Last Dose   aspirin EC 81 MG tablet Take 1 tablet (81 mg total) by mouth daily. 60 tablet 2 Past Month   cyclobenzaprine (FLEXERIL) 10 MG tablet Take 1 tablet (10 mg total) by mouth 3 (three) times daily as needed for muscle spasms. 30 tablet 2 Past Month   Prenatal Vit-Fe Fumarate-FA (PRENATAL MULTIVITAMIN) TABS tablet Take 1 tablet by mouth daily at 12 noon.   08/23/2021   ferrous sulfate  (FERROUSUL) 325 (65 FE) MG tablet Take 1 tablet (325 mg total) by mouth every other day. (Patient not taking: Reported on 07/26/2021) 30 tablet 3    Results for orders placed or performed during the hospital encounter of 08/24/21 (from the past 48 hour(s))  Urinalysis, Routine w reflex microscopic Urine, Clean Catch     Status: Abnormal   Collection Time: 08/24/21 11:56 AM  Result Value Ref Range   Color, Urine YELLOW YELLOW   APPearance HAZY (A) CLEAR   Specific Gravity, Urine 1.010 1.005 - 1.030   pH 8.0 5.0 - 8.0   Glucose, UA NEGATIVE NEGATIVE mg/dL   Hgb urine dipstick TRACE (A) NEGATIVE   Bilirubin Urine NEGATIVE NEGATIVE   Ketones, ur NEGATIVE NEGATIVE mg/dL   Protein, ur 30 (A) NEGATIVE mg/dL   Nitrite NEGATIVE NEGATIVE   Leukocytes,Ua TRACE (A) NEGATIVE    Comment: Performed at [facility], [address] [place], [place] [address]  Urinalysis, Microscopic (reflex)     Status:  Abnormal   Collection Time: 08/24/21 11:56 AM  Result Value Ref Range   RBC / HPF 11-20 0 - 5 RBC/hpf   WBC, UA 11-20 0 - 5 WBC/hpf   Bacteria, UA MANY (A) NONE SEEN   Squamous Epithelial / LPF 11-20 0 - 5   Mucus PRESENT     Comment: Performed at [facility], [address] [place], [place] [address]  CBC with Differential/Platelet     Status: Abnormal   Collection Time: 08/24/21 12:08 PM  Result Value Ref Range   WBC 12.7 (H) 4.0 - 10.5 K/uL   RBC 3.51 (L) 3.87 - 5.11 MIL/uL   Hemoglobin 8.9 (L) 12.0 - 15.0 g/dL   HCT 19.1 (L) 47.8 - 29.5 %   MCV 80.6 80.0 - 100.0 fL   MCH 25.4 (L) 26.0 - 34.0 pg   MCHC 31.4 30.0 - 36.0 g/dL   RDW 62.1 30.8 - 65.7 %   Platelets 231 150 - 400 K/uL   nRBC 0.2 0.0 - 0.2 %   Neutrophils Relative % 74 %   Neutro Abs 9.5 (H) 1.7 - 7.7 K/uL   Lymphocytes Relative 17 %   Lymphs Abs 2.2 0.7 - 4.0 K/uL   Monocytes Relative 7 %   Monocytes Absolute 0.8 0.1 - 1.0 K/uL   Eosinophils Relative 1 %   Eosinophils Absolute 0.1 0.0 - 0.5 K/uL   Basophils  Relative 0 %   Basophils Absolute 0.0 0.0 - 0.1 K/uL   Immature Granulocytes 1 %   Abs Immature Granulocytes 0.09 (H) 0.00 - 0.07 K/uL    Comment: Performed at [facility], [address], [place], [place] [address]  Comprehensive metabolic panel     Status: Abnormal   Collection Time: 08/24/21 12:08 PM  Result Value Ref Range   Sodium 138 135 - 145 mmol/L   Potassium 3.0 (L) 3.5 - 5.1 mmol/L   Chloride 106 98 - 111 mmol/L   CO2 25 22 - 32 mmol/L   Glucose, Bld 80 70 - 99 mg/dL    Comment: Glucose reference range applies only to samples taken after fasting for at least 8 hours.   BUN <5 (L) 6 - 20 mg/dL   Creatinine, Ser 2.95 0.44 - 1.00 mg/dL   Calcium 8.3 (L) 8.9 - 10.3 mg/dL   Total Protein 5.5 (L) 6.5 - 8.1 g/dL   Albumin 2.4 (L) 3.5 - 5.0 g/dL   AST 21 15 - 41 U/L   ALT 13 0 - 44 U/L   Alkaline Phosphatase 105 38 - 126 U/L   Total Bilirubin 0.5 0.3 - 1.2 mg/dL   GFR, Estimated >28 >41 mL/min    Comment: (NOTE) Calculated using the CKD-EPI Creatinine Equation (2021)    Anion gap 7 5 - 15    Comment: Performed at [facility], [address] [place], [place] [address]     Review of Systems  Constitutional:  Negative for fever.  Gastrointestinal:  Positive for nausea and vomiting. Negative for abdominal pain.  Genitourinary:  Negative for vaginal bleeding.  Physical Exam   Blood pressure 117/62, pulse 91, temperature 98.5 F (36.9 C), temperature source Oral, resp. rate 16, height 5' 3.5" (1.613 m), weight 103.8 kg, last menstrual period 12/21/2020, SpO2 98 %, unknown if currently breastfeeding.  Physical Exam Constitutional:      General: She is not in acute distress.    Appearance: Normal appearance. She is not ill-appearing, toxic-appearing or diaphoretic.  Eyes:     Pupils: Pupils are equal, round, and reactive to light.  Musculoskeletal:        General: Normal range of motion.  Skin:    General: Skin is warm.  Neurological:     Mental Status:  She is alert and oriented to person, place, and time.  Psychiatric:        Behavior: Behavior normal.   Fetal Tracing: Baseline: 130 bpm Variability: Moderate  Accelerations: 15x15 Decelerations: None Toco: None  MAU Course  Procedures  MDM  UA without signs of dehydration Reglan 10 mg PO & Zofran 4 mg given PO Potassium 3.0, kdur given 40 meq, patient tolerated oral fluids and kdur. Patient reports she needs to leave and left the unit prior to me speaking to her.   Assessment and Plan   A:  Nausea and vomiting, unspecified vomiting type - Plan: Discharge patient  Has poorly balanced diet - Plan: Discharge patient  [redacted] weeks gestation of pregnancy    P:  Discharge home RX: Reglan, zofran, kdur   Venia Carbon I, NP 08/24/2021 4:53 PM

## 2021-08-24 NOTE — Telephone Encounter (Signed)
Pt called about being nauseous and not being able to keep anything down including ice chips I spoke to clinical staff and they advised her to go to MAU for fluids

## 2021-08-24 NOTE — MAU Note (Signed)
Pt needed to leave could not wait for provider to go over results with her or her paperwork. Notified J.Rasch,NP.

## 2021-08-24 NOTE — MAU Note (Signed)
Everything she eats, she can't keep it down.  Started before Thanksgiving. Denies diarrhea, constipation  or fever. No pain. No bleeding or leaking. +FM reported

## 2021-08-31 ENCOUNTER — Other Ambulatory Visit (HOSPITAL_COMMUNITY)
Admission: RE | Admit: 2021-08-31 | Discharge: 2021-08-31 | Disposition: A | Discharge status: Home / Self Care | Payer: BC Managed Care – PPO | Source: Ambulatory Visit | Attending: Obstetrics & Gynecology | Admitting: Obstetrics & Gynecology

## 2021-08-31 ENCOUNTER — Encounter: Payer: Self-pay | Admitting: Obstetrics & Gynecology

## 2021-08-31 ENCOUNTER — Other Ambulatory Visit: Payer: Self-pay

## 2021-08-31 ENCOUNTER — Ambulatory Visit (INDEPENDENT_AMBULATORY_CARE_PROVIDER_SITE_OTHER): Payer: BC Managed Care – PPO | Admitting: Obstetrics & Gynecology

## 2021-08-31 VITALS — BP 127/81 | HR 89 | Wt 231.0 lb

## 2021-08-31 DIAGNOSIS — O0993 Supervision of high risk pregnancy, unspecified, third trimester: Secondary | ICD-10-CM | POA: Insufficient documentation

## 2021-08-31 DIAGNOSIS — O99210 Obesity complicating pregnancy, unspecified trimester: Secondary | ICD-10-CM

## 2021-08-31 DIAGNOSIS — Z3A36 36 weeks gestation of pregnancy: Secondary | ICD-10-CM | POA: Diagnosis not present

## 2021-08-31 DIAGNOSIS — O99013 Anemia complicating pregnancy, third trimester: Secondary | ICD-10-CM

## 2021-08-31 DIAGNOSIS — O26843 Uterine size-date discrepancy, third trimester: Secondary | ICD-10-CM

## 2021-08-31 DIAGNOSIS — O0990 Supervision of high risk pregnancy, unspecified, unspecified trimester: Secondary | ICD-10-CM

## 2021-08-31 DIAGNOSIS — O219 Vomiting of pregnancy, unspecified: Secondary | ICD-10-CM

## 2021-08-31 LAB — OB RESULTS CONSOLE GC/CHLAMYDIA: Gonorrhea: NEGATIVE

## 2021-08-31 MED ORDER — ONDANSETRON 4 MG PO TBDP: 4.0000 mg | ORAL_TABLET | Freq: Four times a day (QID) | ORAL | 2 refills | Status: DC | PRN

## 2021-08-31 MED ORDER — PANTOPRAZOLE SODIUM 20 MG PO TBEC: 20.0000 mg | DELAYED_RELEASE_TABLET | Freq: Every day | ORAL | 2 refills | Status: DC

## 2021-08-31 NOTE — Patient Instructions (Signed)
Return to office for any scheduled appointments. Call the office or go to the MAU at [facility] [place] if:  You begin to have strong, frequent contractions  Your water breaks.  Sometimes it is a big gush of fluid, sometimes it is just a trickle that keeps getting your panties wet or running down your legs  You have vaginal bleeding.  It is normal to have a small amount of spotting if your cervix was checked.   You do not feel your baby moving like normal.  If you do not, get something to eat and drink and lay down and focus on feeling your baby move.   If your baby is still not moving like normal, you should call the office or go to MAU.  Any other obstetric concerns.   

## 2021-08-31 NOTE — Progress Notes (Signed)
   PRENATAL VISIT NOTE  Subjective:  Amy Singh is a 20 y.o. G2P0010 at [redacted]w[redacted]d being seen today for ongoing prenatal care.  She is currently monitored for the following issues for this high-risk pregnancy and has History of marijuana use; IUFD at less than 20 weeks of gestation; Supervision of high risk pregnancy, antepartum; Gonorrhea affecting pregnancy; Chlamydia infection affecting pregnancy; Alpha thalassemia silent carrier; Drug use affecting pregnancy; Anemia in pregnancy, third trimester; and Maternal morbid obesity, antepartum (HCC) on their problem list.  Patient reports occasional contractions. Also nausea and heartburn for a week.  Contractions: Irritability. Vag. Bleeding: None.  Movement: Present. Denies leaking of fluid.   The following portions of the patient's history were reviewed and updated as appropriate: allergies, current medications, past family history, past medical history, past social history, past surgical history and problem list.   Objective:   Vitals:   08/31/21 0856  BP: 127/81  Pulse: 89  Weight: 231 lb (104.8 kg)    Fetal Status: Fetal Heart Rate (bpm): 137 Fundal Height: 39 cm Movement: Present  Presentation: Vertex  General:  Alert, oriented and cooperative. Patient is in no acute distress.  Skin: Skin is warm and dry. No rash noted.   Cardiovascular: Normal heart rate noted  Respiratory: Normal respiratory effort, no problems with respiration noted  Abdomen: Soft, gravid, appropriate for gestational age.  Pain/Pressure: Present     Pelvic: Cervical exam performed in the presence of a chaperone Dilation: Closed Effacement (%): Thick Station: Ballotable  Extremities: Normal range of motion.  Edema: Mild pitting, slight indentation  Mental Status: Normal mood and affect. Normal behavior. Normal judgment and thought content.   Assessment and Plan:  Pregnancy: G2P0010 at [redacted]w[redacted]d 1. Anemia in pregnancy, third trimester CBC Latest Ref Rng & Units  08/24/2021 07/07/2021 03/14/2021  WBC 4.0 - 10.5 K/uL 12.7(H) 10.4 8.3  Hemoglobin 12.0 - 15.0 g/dL 6.3(F) 10.2(L) 11.9  Hematocrit 36.0 - 46.0 % 28.3(L) 30.4(L) 37.3  Platelets 150 - 400 K/uL 231 217 206  Not taking oral iron Counseled about IV iron, ordered for Venofer x 2 weekly doses.   2. Nausea and vomiting of pregnancy, antepartum Antiemetic and heart burn medication ordered. - pantoprazole (PROTONIX) 20 MG tablet; Take 1 tablet (20 mg total) by mouth daily.  Dispense: 30 tablet; Refill: 2 - ondansetron (ZOFRAN-ODT) 4 MG disintegrating tablet; Take 1 tablet (4 mg total) by mouth every 6 (six) hours as needed for nausea.  Dispense: 20 tablet; Refill: 2  3. Maternal morbid obesity, antepartum (HCC) 4. Elevated fundal height, third trimester Growth scan ordered. Patient has gained 54 lbs. - [place] MFM OB FOLLOW UP; Future  5. [redacted] weeks gestation of pregnancy 6. Supervision of high risk pregnancy, antepartum Pelvic cultures done, will follow up results and manage accordingly. - Cervicovaginal ancillary only - Strep Gp B NAA Preterm labor symptoms and general obstetric precautions including but not limited to vaginal bleeding, contractions, leaking of fluid and fetal movement were reviewed in detail with the patient. Please refer to After Visit Summary for other counseling recommendations.   Return in about 1 week (around 09/07/2021) for OFFICE OB VISIT (MD or APP).  Future Appointments  Date Time Provider Department Center  09/06/2021  8:55 AM [place] Bing, MD [facility]  09/13/2021  8:55 AM [place] Toran, Jethro Bastos, MD [facility]  09/20/2021  8:55 AM [place] Bing, MD [facility]    Jaynie Collins, MD

## 2021-09-01 LAB — CERVICOVAGINAL ANCILLARY ONLY
Chlamydia: NEGATIVE
Comment: NEGATIVE
Comment: NEGATIVE
Comment: NORMAL
Neisseria Gonorrhea: NEGATIVE
Trichomonas: NEGATIVE

## 2021-09-02 LAB — STREP GP B NAA: Strep Gp B NAA: NEGATIVE

## 2021-09-06 ENCOUNTER — Ambulatory Visit (INDEPENDENT_AMBULATORY_CARE_PROVIDER_SITE_OTHER): Payer: BC Managed Care – PPO | Admitting: Obstetrics and Gynecology

## 2021-09-06 ENCOUNTER — Other Ambulatory Visit: Payer: Self-pay

## 2021-09-06 VITALS — BP 126/81 | HR 89 | Wt 225.0 lb

## 2021-09-06 DIAGNOSIS — Z3A37 37 weeks gestation of pregnancy: Secondary | ICD-10-CM

## 2021-09-06 DIAGNOSIS — Z6841 Body Mass Index (BMI) 40.0 and over, adult: Secondary | ICD-10-CM | POA: Insufficient documentation

## 2021-09-06 DIAGNOSIS — Z6839 Body mass index (BMI) 39.0-39.9, adult: Secondary | ICD-10-CM | POA: Insufficient documentation

## 2021-09-06 DIAGNOSIS — O99210 Obesity complicating pregnancy, unspecified trimester: Secondary | ICD-10-CM

## 2021-09-06 DIAGNOSIS — O99013 Anemia complicating pregnancy, third trimester: Secondary | ICD-10-CM

## 2021-09-06 DIAGNOSIS — O0990 Supervision of high risk pregnancy, unspecified, unspecified trimester: Secondary | ICD-10-CM

## 2021-09-06 NOTE — Progress Notes (Signed)
    PRENATAL VISIT NOTE  Subjective:  Amy Singh is a 20 y.o. G2P0010 at [redacted]w[redacted]d being seen today for ongoing prenatal care.  She is currently monitored for the following issues for this high-risk pregnancy and has History of marijuana use; IUFD at less than 20 weeks of gestation; Supervision of high risk pregnancy, antepartum; Gonorrhea affecting pregnancy; Chlamydia infection affecting pregnancy; Alpha thalassemia silent carrier; Drug use affecting pregnancy; Anemia in pregnancy, third trimester; Maternal morbid obesity, antepartum (HCC); and BMI 39.0-39.9,adult on their problem list.  Patient reports no complaints.  Contractions: Irritability. Vag. Bleeding: None.  Movement: Present. Denies leaking of fluid.   The following portions of the patient's history were reviewed and updated as appropriate: allergies, current medications, past family history, past medical history, past social history, past surgical history and problem list.   Objective:   Vitals:   09/06/21 0900  BP: 126/81  Pulse: 89  Weight: 225 lb (102.1 kg)    Fetal Status: Fetal Heart Rate (bpm): 147 Fundal Height: 38 cm Movement: Present  Presentation: Vertex  General:  Alert, oriented and cooperative. Patient is in no acute distress.  Skin: Skin is warm and dry. No rash noted.   Cardiovascular: Normal heart rate noted  Respiratory: Normal respiratory effort, no problems with respiration noted  Abdomen: Soft, gravid, appropriate for gestational age.  Pain/Pressure: Present     Pelvic: Cervical exam deferred        Extremities: Normal range of motion.  Edema: Moderate pitting, indentation subsides rapidly  Mental Status: Normal mood and affect. Normal behavior. Normal judgment and thought content.   Assessment and Plan:  Pregnancy: G2P0010 at [redacted]w[redacted]d 1. Supervision of high risk pregnancy, antepartum GBS, STI swabs neg Surveillance growth u/s later this week -11/1: afi 14, ceph, 43%, 1711g, ac 51%  2. [redacted] weeks  gestation of pregnancy  3. Maternal morbid obesity, antepartum (HCC) Weight stable  4. BMI 39.0-39.9,adult  5. Anemia in pregnancy, third trimester Iv iron on 12/19  Term labor symptoms and general obstetric precautions including but not limited to vaginal bleeding, contractions, leaking of fluid and fetal movement were reviewed in detail with the patient. Please refer to After Visit Summary for other counseling recommendations.   Return in about 1 week (around 09/13/2021) for low risk ob, in person, md or app.  Future Appointments  Date Time Provider Department Center  09/08/2021  7:15 AM [facility] NURSE [facility]  09/08/2021  7:30 AM [facility] US2 [facility]  09/12/2021  9:00 AM [id number] None  09/13/2021  8:55 AM Anyanwu, Jethro Bastos, MD [facility]  09/20/2021  8:55 AM [place] Bing, MD [facility]    [place] Bing, MD

## 2021-09-08 ENCOUNTER — Encounter: Payer: Self-pay | Admitting: *Deleted

## 2021-09-08 ENCOUNTER — Ambulatory Visit: Payer: BC Managed Care – PPO | Attending: Obstetrics & Gynecology

## 2021-09-08 ENCOUNTER — Ambulatory Visit: Payer: BC Managed Care – PPO | Admitting: *Deleted

## 2021-09-08 ENCOUNTER — Other Ambulatory Visit: Payer: Self-pay

## 2021-09-08 VITALS — BP 130/72 | HR 77

## 2021-09-08 DIAGNOSIS — O0990 Supervision of high risk pregnancy, unspecified, unspecified trimester: Secondary | ICD-10-CM | POA: Diagnosis not present

## 2021-09-08 DIAGNOSIS — O99323 Drug use complicating pregnancy, third trimester: Secondary | ICD-10-CM | POA: Diagnosis not present

## 2021-09-08 DIAGNOSIS — O26843 Uterine size-date discrepancy, third trimester: Secondary | ICD-10-CM | POA: Diagnosis not present

## 2021-09-08 DIAGNOSIS — O99210 Obesity complicating pregnancy, unspecified trimester: Secondary | ICD-10-CM | POA: Insufficient documentation

## 2021-09-12 ENCOUNTER — Other Ambulatory Visit: Payer: Self-pay

## 2021-09-12 ENCOUNTER — Ambulatory Visit (HOSPITAL_COMMUNITY)
Admission: RE | Admit: 2021-09-12 | Discharge: 2021-09-12 | Disposition: A | Discharge status: Home / Self Care | Payer: BC Managed Care – PPO | Source: Ambulatory Visit | Attending: Obstetrics & Gynecology | Admitting: Obstetrics & Gynecology

## 2021-09-12 DIAGNOSIS — D649 Anemia, unspecified: Secondary | ICD-10-CM | POA: Insufficient documentation

## 2021-09-12 DIAGNOSIS — Z3A00 Weeks of gestation of pregnancy not specified: Secondary | ICD-10-CM | POA: Insufficient documentation

## 2021-09-12 DIAGNOSIS — O99013 Anemia complicating pregnancy, third trimester: Secondary | ICD-10-CM | POA: Diagnosis not present

## 2021-09-12 MED ORDER — ALBUTEROL SULFATE (2.5 MG/3ML) 0.083% IN NEBU: 2.5000 mg | INHALATION_SOLUTION | Freq: Once | RESPIRATORY_TRACT | Status: DC | PRN

## 2021-09-12 MED ORDER — SODIUM CHLORIDE 0.9 % IV SOLN: INTRAVENOUS | Status: DC | PRN

## 2021-09-12 MED ORDER — METHYLPREDNISOLONE SODIUM SUCC 125 MG IJ SOLR: 125.0000 mg | Freq: Once | INTRAMUSCULAR | Status: DC | PRN

## 2021-09-12 MED ORDER — SODIUM CHLORIDE 0.9 % IV SOLN
300.0000 mg | INTRAVENOUS | Status: DC
  Administered 2021-09-12: 10:00:00 300 mg via INTRAVENOUS
  Filled 2021-09-12: qty 15

## 2021-09-12 MED ORDER — EPINEPHRINE PF 1 MG/ML IJ SOLN: 0.3000 mg | Freq: Once | INTRAMUSCULAR | Status: DC | PRN

## 2021-09-12 MED ORDER — DIPHENHYDRAMINE HCL 50 MG/ML IJ SOLN: 25.0000 mg | Freq: Once | INTRAMUSCULAR | Status: DC | PRN

## 2021-09-12 MED ORDER — SODIUM CHLORIDE 0.9 % IV BOLUS: 500.0000 mL | Freq: Once | INTRAVENOUS | Status: DC | PRN

## 2021-09-13 ENCOUNTER — Ambulatory Visit (INDEPENDENT_AMBULATORY_CARE_PROVIDER_SITE_OTHER): Payer: BC Managed Care – PPO | Admitting: Obstetrics & Gynecology

## 2021-09-13 ENCOUNTER — Encounter: Payer: Self-pay | Admitting: Obstetrics & Gynecology

## 2021-09-13 ENCOUNTER — Other Ambulatory Visit: Payer: Self-pay

## 2021-09-13 VITALS — BP 121/75 | HR 84 | Wt 229.0 lb

## 2021-09-13 DIAGNOSIS — Z3A38 38 weeks gestation of pregnancy: Secondary | ICD-10-CM

## 2021-09-13 DIAGNOSIS — O0990 Supervision of high risk pregnancy, unspecified, unspecified trimester: Secondary | ICD-10-CM

## 2021-09-13 NOTE — Patient Instructions (Addendum)
Return to office for any scheduled appointments. Call the office or go to the [facility] at [facility]  if: You begin to have strong, frequent contractions Your water breaks.  Sometimes it is a big gush of fluid, sometimes it is just a trickle that keeps getting your panties wet or running down your legs You have vaginal bleeding.  It is normal to have a small amount of spotting if your cervix was checked.  You do not feel your baby moving like normal.  If you do not, get something to eat and drink and lay down and focus on feeling your baby move.   If your baby is still not moving like normal, you should call the office or go to MAU. Any other obstetric concerns.    Cervical Ripening (to get your cervix ready for labor) : May try one or all:  Red Raspberry Leaf capsules:  two 300mg or 400mg tablets with each meal, 2-3 times a day  Potential Side Effects Of Raspberry Leaf:  Most women do not experience any side effects from drinking raspberry leaf tea. However, nausea and loose stools are possible   Evening Primrose Oil capsules: may take 1 to 3 capsules daily. May also prick one to release the oil and insert it into your vagina at night.  Some of the potential side effects:  Upset stomach  Loose stools or diarrhea  Headaches  Nausea  6 Dates a day (may taste better if warmed in microwave until soft). Found where raisins are in the grocery store  

## 2021-09-13 NOTE — Progress Notes (Signed)
PRENATAL VISIT NOTE  Subjective:  Amy Singh is a 20 y.o. G2P0010 at [redacted]w[redacted]d being seen today for ongoing prenatal care.  She is currently monitored for the following issues for this high-risk pregnancy and has History of marijuana use; IUFD at less than 20 weeks of gestation; Supervision of high risk pregnancy, antepartum; Gonorrhea affecting pregnancy; Chlamydia infection affecting pregnancy; Alpha thalassemia silent carrier; Drug use affecting pregnancy; Anemia in pregnancy, third trimester; Maternal morbid obesity, antepartum ([place]); and BMI 39.0-39.9,adult on their problem list.  Patient reports no complaints.  Contractions: Irritability. Vag. Bleeding: None.  Movement: Present. Denies leaking of fluid.   The following portions of the patient's history were reviewed and updated as appropriate: allergies, current medications, past family history, past medical history, past social history, past surgical history and problem list.   Objective:   Vitals:   09/13/21 0946  BP: 121/75  Pulse: 84  Weight: 229 lb (103.9 kg)    Fetal Status: Fetal Heart Rate (bpm): 145   Movement: Present     General:  Alert, oriented and cooperative. Patient is in no acute distress.  Skin: Skin is warm and dry. No rash noted.   Cardiovascular: Normal heart rate noted  Respiratory: Normal respiratory effort, no problems with respiration noted  Abdomen: Soft, gravid, appropriate for gestational age.  Pain/Pressure: Present     Pelvic: Cervical exam deferred        Extremities: Normal range of motion.  Edema: Moderate pitting, indentation subsides rapidly  Mental Status: Normal mood and affect. Normal behavior. Normal judgment and thought content.   [place] MFM OB FOLLOW UP  Result Date: 09/08/2021 ----------------------------------------------------------------------  OBSTETRICS REPORT                       (Signed Final 09/08/2021 09:02 am)  ---------------------------------------------------------------------- Patient Info  ID #:       [id number]                          D.O.B.:  2001-07-15 (20 yrs)  Name:       Amy Singh                  Visit Date: 09/08/2021 07:27 am ---------------------------------------------------------------------- Performed By  Attending:        Johnell Comings MD         Ref. Address:     [place], [place]  [address]  Performed By:     Jacob Moores BS,       [facility]                                                             [facility]  Referred By:      Osborne Oman MD ---------------------------------------------------------------------- Orders  #  Description                           Code        Ordered By  1  [place] MFM OB FOLLOW UP                   [id number]    Verita Schneiders ----------------------------------------------------------------------  #  Order #                     Accession #                Episode #  1  [id number]                   [id number]                 [id number] ---------------------------------------------------------------------- Indications  Obesity complicating pregnancy, third          O99.213  trimester (pregravid BMI 31)  Poor obstetric history: Previous IUFD          O09.299  (stillbirth at 47 weeks)  [redacted] weeks gestation of pregnancy                Z3A.37  Genetic carrier (Alpha Thal)                   Z14.8  LR NIPS  Drug use complicating pregnancy, third         O99.323  trimester ([facility]C)  Encounter for other antenatal screening        Z36.2  follow-up  ---------------------------------------------------------------------- Fetal Evaluation  Num Of Fetuses:         1  Fetal Heart Rate(bpm):  133  Cardiac Activity:       Observed  Presentation:           Cephalic  Placenta:               Posterior Fundal  P. Cord Insertion:      Previously Visualized  Amniotic  Fluid  AFI FV:      Within normal limits  AFI Sum(cm)     %Tile       Largest Pocket(cm)  10.9            31          3.9  RUQ(cm)       RLQ(cm)       LUQ(cm)        LLQ(cm)  0             3.3           3.9            3.9 ---------------------------------------------------------------------- Biometry  BPD:      87.4  mm     G. Age:  35w 2d         15  %    CI:        76.84   %    70 - 86                                                          FL/HC:      22.0   %    20.8 - 22.6  HC:      315.8  mm     G. Age:  35w 3d        2.9  %    HC/AC:      0.91        0.92 - 1.05  AC:      348.7  mm     G. Age:  38w 5d         93  %    FL/BPD:     79.6   %    71 - 87  FL:       69.6  mm     G. Age:  35w 5d         14  %    FL/AC:      20.0   %    20 - 24  LV:          4  mm  Est. FW:    3165  gm           7 lb     58  % ---------------------------------------------------------------------- OB History  Gravidity:    2  Living:       0 ---------------------------------------------------------------------- Gestational Age  LMP:           37w 2d        Date:  12/21/20                 EDD:   09/27/21  U/S Today:     36w 2d                                        EDD:   10/04/21  Best:          37w 2d     Det. By:  LMP  (12/21/20)          EDD:   09/27/21 ---------------------------------------------------------------------- Anatomy  Cranium:  Appears normal         LVOT:                   Previously seen  Cavum:                 Appears normal         Aortic Arch:            Previously seen  Ventricles:            Appears normal         Ductal Arch:            Previously seen  Choroid Plexus:        Previously seen         Diaphragm:              Appears normal  Cerebellum:            Previously seen        Stomach:                Appears normal, left                                                                        sided  Posterior Fossa:       Previously seen        Abdomen:                Previously seen  Nuchal Fold:           Previously seen        Abdominal Wall:         Previously seen  Face:                  Orbits and profile     Cord Vessels:           Previously seen                         previously seen  Lips:                  Previously seen        Kidneys:                Appear normal  Palate:                Previously seen        Bladder:                Appears normal  Thoracic:              Appears normal         Spine:                  Previously seen  Heart:                 Appears normal         Upper Extremities:      Previously seen                         (4CH, axis, and  situs)  RVOT:                  Previously seen        Lower Extremities:      Previously seen  Other:  Nasal bone, lenses, 3VV, 3VT and VC previously visualized. Female          gender previously seen. Hands and feet visualized. Technically          difficult due to maternal habitus. ---------------------------------------------------------------------- Cervix Uterus Adnexa  Cervix  Not visualized (advanced GA >24wks)  Uterus  No abnormality visualized.  Right Ovary  Within normal limits.  Left Ovary  Within normal limits.  Cul De Sac  No free fluid seen.  Adnexa  No abnormality visualized. ---------------------------------------------------------------------- Comments  This patient was seen for a follow up growth scan as she has  had a large amount of weight gain during her pregnancy.  The fetal growth and amniotic fluid level appears appropriate  for her gestational age.  Follow-up as indicated. ----------------------------------------------------------------------                   Johnell Comings, MD  Electronically Signed Final Report   09/08/2021 09:02 am ----------------------------------------------------------------------   Assessment and Plan:  Pregnancy: G2P0010 at [redacted]w[redacted]d 1. [redacted] weeks gestation of pregnancy 2. Supervision of high risk pregnancy, antepartum No concerns.  Labor symptoms and general obstetric precautions including but not limited to vaginal bleeding, contractions, leaking of fluid and fetal movement were reviewed in detail with the patient. Please refer to After Visit Summary for other counseling recommendations.   Return in about 1 week (around 09/20/2021) for OFFICE OB VISIT (MD or APP).  Future Appointments  Date Time Provider [place]  09/20/2021  8:55 AM Aletha Halim, MD [facility]  09/20/2021 11:00 AM [facility] None    Verita Schneiders, MD

## 2021-09-20 ENCOUNTER — Encounter (HOSPITAL_COMMUNITY): Payer: Self-pay | Admitting: *Deleted

## 2021-09-20 ENCOUNTER — Ambulatory Visit (HOSPITAL_COMMUNITY)
Admission: RE | Admit: 2021-09-20 | Discharge: 2021-09-20 | Disposition: A | Discharge status: Home / Self Care | Payer: BC Managed Care – PPO | Source: Ambulatory Visit | Attending: Obstetrics & Gynecology | Admitting: Obstetrics & Gynecology

## 2021-09-20 ENCOUNTER — Ambulatory Visit (INDEPENDENT_AMBULATORY_CARE_PROVIDER_SITE_OTHER): Payer: BC Managed Care – PPO | Admitting: Obstetrics and Gynecology

## 2021-09-20 ENCOUNTER — Telehealth (HOSPITAL_COMMUNITY): Payer: Self-pay | Admitting: *Deleted

## 2021-09-20 ENCOUNTER — Other Ambulatory Visit: Payer: Self-pay

## 2021-09-20 VITALS — BP 130/86 | HR 88 | Wt 234.0 lb

## 2021-09-20 DIAGNOSIS — O99284 Endocrine, nutritional and metabolic diseases complicating childbirth: Secondary | ICD-10-CM | POA: Diagnosis not present

## 2021-09-20 DIAGNOSIS — O0990 Supervision of high risk pregnancy, unspecified, unspecified trimester: Secondary | ICD-10-CM

## 2021-09-20 DIAGNOSIS — O99013 Anemia complicating pregnancy, third trimester: Secondary | ICD-10-CM

## 2021-09-20 DIAGNOSIS — O99210 Obesity complicating pregnancy, unspecified trimester: Secondary | ICD-10-CM

## 2021-09-20 DIAGNOSIS — Z3A39 39 weeks gestation of pregnancy: Secondary | ICD-10-CM | POA: Diagnosis not present

## 2021-09-20 DIAGNOSIS — D563 Thalassemia minor: Secondary | ICD-10-CM | POA: Diagnosis not present

## 2021-09-20 DIAGNOSIS — O99214 Obesity complicating childbirth: Secondary | ICD-10-CM | POA: Diagnosis not present

## 2021-09-20 DIAGNOSIS — Z6841 Body Mass Index (BMI) 40.0 and over, adult: Secondary | ICD-10-CM

## 2021-09-20 DIAGNOSIS — O021 Missed abortion: Secondary | ICD-10-CM

## 2021-09-20 DIAGNOSIS — Z20822 Contact with and (suspected) exposure to covid-19: Secondary | ICD-10-CM | POA: Diagnosis not present

## 2021-09-20 DIAGNOSIS — O1414 Severe pre-eclampsia complicating childbirth: Secondary | ICD-10-CM | POA: Diagnosis not present

## 2021-09-20 DIAGNOSIS — O98813 Other maternal infectious and parasitic diseases complicating pregnancy, third trimester: Secondary | ICD-10-CM

## 2021-09-20 DIAGNOSIS — O139 Gestational [pregnancy-induced] hypertension without significant proteinuria, unspecified trimester: Secondary | ICD-10-CM

## 2021-09-20 DIAGNOSIS — O09292 Supervision of pregnancy with other poor reproductive or obstetric history, second trimester: Secondary | ICD-10-CM | POA: Diagnosis not present

## 2021-09-20 DIAGNOSIS — R030 Elevated blood-pressure reading, without diagnosis of hypertension: Secondary | ICD-10-CM | POA: Diagnosis not present

## 2021-09-20 DIAGNOSIS — O98213 Gonorrhea complicating pregnancy, third trimester: Secondary | ICD-10-CM

## 2021-09-20 DIAGNOSIS — O99323 Drug use complicating pregnancy, third trimester: Secondary | ICD-10-CM | POA: Diagnosis not present

## 2021-09-20 DIAGNOSIS — E876 Hypokalemia: Secondary | ICD-10-CM | POA: Diagnosis not present

## 2021-09-20 DIAGNOSIS — A749 Chlamydial infection, unspecified: Secondary | ICD-10-CM

## 2021-09-20 MED ORDER — SODIUM CHLORIDE 0.9 % IV BOLUS: 500.0000 mL | Freq: Once | INTRAVENOUS | Status: DC | PRN

## 2021-09-20 MED ORDER — METHYLPREDNISOLONE SODIUM SUCC 125 MG IJ SOLR: 125.0000 mg | Freq: Once | INTRAMUSCULAR | Status: DC | PRN

## 2021-09-20 MED ORDER — DIPHENHYDRAMINE HCL 50 MG/ML IJ SOLN: 25.0000 mg | Freq: Once | INTRAMUSCULAR | Status: DC | PRN

## 2021-09-20 MED ORDER — EPINEPHRINE PF 1 MG/ML IJ SOLN: 0.3000 mg | Freq: Once | INTRAMUSCULAR | Status: DC | PRN

## 2021-09-20 MED ORDER — ALBUTEROL SULFATE (2.5 MG/3ML) 0.083% IN NEBU: 2.5000 mg | INHALATION_SOLUTION | Freq: Once | RESPIRATORY_TRACT | Status: DC | PRN

## 2021-09-20 MED ORDER — SODIUM CHLORIDE 0.9 % IV SOLN: INTRAVENOUS | Status: DC | PRN

## 2021-09-20 MED ORDER — SODIUM CHLORIDE 0.9 % IV SOLN
300.0000 mg | INTRAVENOUS | Status: DC
  Administered 2021-09-20: 12:00:00 300 mg via INTRAVENOUS
  Filled 2021-09-20: qty 300

## 2021-09-20 NOTE — Telephone Encounter (Signed)
Preadmission screen  

## 2021-09-20 NOTE — Progress Notes (Signed)
° ° °  PRENATAL VISIT NOTE  Subjective:  Amy Singh is a 20 y.o. G2P0010 at [redacted]w[redacted]d being seen today for ongoing prenatal care.  She is currently monitored for the following issues for this high-risk pregnancy and has History of marijuana use; IUFD at less than 20 weeks of gestation; Supervision of high risk pregnancy, antepartum; Gonorrhea affecting pregnancy; Chlamydia infection affecting pregnancy; Alpha thalassemia silent carrier; Drug use affecting pregnancy; Anemia in pregnancy, third trimester; Maternal morbid obesity, antepartum ([facility]); and BMI 40.0-44.9, adult ([facility]) on their problem list.  Patient reports no complaints.  Contractions: Irritability. Vag. Bleeding: None.  Movement: Present. Denies leaking of fluid.   The following portions of the patient's history were reviewed and updated as appropriate: allergies, current medications, past family history, past medical history, past social history, past surgical history and problem list.   Objective:   Vitals:   09/20/21 0902 09/20/21 0910  BP: (!) 146/94 130/86  Pulse: 87 88  Weight: 234 lb (106.1 kg)     Fetal Status: Fetal Heart Rate (bpm): 133 Fundal Height: 39 cm Movement: Present  Presentation: Vertex  General:  Alert, oriented and cooperative. Patient is in no acute distress.  Skin: Skin is warm and dry. No rash noted.   Cardiovascular: Normal heart rate noted  Respiratory: Normal respiratory effort, no problems with respiration noted  Abdomen: Soft, gravid, appropriate for gestational age.  Pain/Pressure: Present     Pelvic: Cervical exam performed in the presence of a chaperone Dilation: 2 Effacement (%): 50 Station: -3  Extremities: Normal range of motion.  Edema: Moderate pitting, indentation subsides rapidly  Mental Status: Normal mood and affect. Normal behavior. Normal judgment and thought content.   Assessment and Plan:  Pregnancy: G2P0010 at [redacted]w[redacted]d 1. Antepartum transient hypertension 11/1: 145/72, no  repeat 12/15: 143/74, repeat normal 130/72 Today 146/94, twg 5lbs from 12/20. 10 minute repeat 130/86 Udip with trace protein  Given 39wks, somewhat favorable cervix, I recommend IOL at next available date which she is amenable to. Patient set up for 12/31 during the day for IOL. RTC 3 days for RN BP check. Precautions given   2. [redacted] weeks gestation of pregnancy GBS neg  3. Maternal morbid obesity, antepartum (HCC)  4. BMI 40.0-44.9, adult ([facility]CC)  5. Gonorrhea affecting pregnancy in third trimester Neg with gbs  6. Chlamydia infection affecting pregnancy in third trimester See above  7. Supervision of high risk pregnancy, antepartum 12/15: 58%, 3165gm, ac 93%, afi 11  8. IUFD at less than 20 weeks of gestation  66. Anemia in pregnancy, third trimester Has venofer #2 later this morning CBC Latest Ref Rng & Units 08/24/2021 07/07/2021 03/14/2021  WBC 4.0 - 10.5 K/uL 12.7(H) 10.4 8.3  Hemoglobin 12.0 - 15.0 g/dL 4.2(A) 10.2(L) 11.9  Hematocrit 36.0 - 46.0 % 28.3(L) 30.4(L) 37.3  Platelets 150 - 400 K/uL 231 217 206    Term labor symptoms and general obstetric precautions including but not limited to vaginal bleeding, contractions, leaking of fluid and fetal movement were reviewed in detail with the patient. Please refer to After Visit Summary for other counseling recommendations.   Return in about 3 days (around 09/23/2021) for in person, RN BP check.  Future Appointments  Date Time Provider Department Center  09/20/2021 11:00 AM [facility] None  09/24/2021  6:30 AM [facility] SCHED ROOM [facility] None    Chester Bing, MD

## 2021-09-21 ENCOUNTER — Encounter (INDEPENDENT_AMBULATORY_CARE_PROVIDER_SITE_OTHER): Payer: Self-pay | Admitting: *Deleted

## 2021-09-21 ENCOUNTER — Inpatient Hospital Stay (HOSPITAL_COMMUNITY)
Admission: AD | Admit: 2021-09-21 | Discharge: 2021-09-23 | DRG: 807 | Disposition: A | Discharge status: Home / Self Care | Payer: BC Managed Care – PPO | Attending: Obstetrics and Gynecology | Admitting: Obstetrics and Gynecology

## 2021-09-21 ENCOUNTER — Encounter (HOSPITAL_COMMUNITY): Payer: Self-pay | Admitting: Obstetrics & Gynecology

## 2021-09-21 ENCOUNTER — Other Ambulatory Visit: Payer: Self-pay

## 2021-09-21 DIAGNOSIS — D563 Thalassemia minor: Secondary | ICD-10-CM | POA: Diagnosis present

## 2021-09-21 DIAGNOSIS — O99284 Endocrine, nutritional and metabolic diseases complicating childbirth: Secondary | ICD-10-CM | POA: Diagnosis present

## 2021-09-21 DIAGNOSIS — O139 Gestational [pregnancy-induced] hypertension without significant proteinuria, unspecified trimester: Secondary | ICD-10-CM | POA: Diagnosis present

## 2021-09-21 DIAGNOSIS — Z20822 Contact with and (suspected) exposure to covid-19: Secondary | ICD-10-CM | POA: Diagnosis present

## 2021-09-21 DIAGNOSIS — O98211 Gonorrhea complicating pregnancy, first trimester: Secondary | ICD-10-CM | POA: Diagnosis present

## 2021-09-21 DIAGNOSIS — O1414 Severe pre-eclampsia complicating childbirth: Secondary | ICD-10-CM | POA: Diagnosis present

## 2021-09-21 DIAGNOSIS — E876 Hypokalemia: Secondary | ICD-10-CM | POA: Diagnosis present

## 2021-09-21 DIAGNOSIS — O09292 Supervision of pregnancy with other poor reproductive or obstetric history, second trimester: Secondary | ICD-10-CM | POA: Diagnosis not present

## 2021-09-21 DIAGNOSIS — O021 Missed abortion: Secondary | ICD-10-CM | POA: Diagnosis present

## 2021-09-21 DIAGNOSIS — O99214 Obesity complicating childbirth: Secondary | ICD-10-CM | POA: Diagnosis present

## 2021-09-21 DIAGNOSIS — R030 Elevated blood-pressure reading, without diagnosis of hypertension: Secondary | ICD-10-CM | POA: Diagnosis present

## 2021-09-21 DIAGNOSIS — O99323 Drug use complicating pregnancy, third trimester: Secondary | ICD-10-CM

## 2021-09-21 DIAGNOSIS — O0990 Supervision of high risk pregnancy, unspecified, unspecified trimester: Secondary | ICD-10-CM

## 2021-09-21 DIAGNOSIS — A749 Chlamydial infection, unspecified: Secondary | ICD-10-CM | POA: Diagnosis present

## 2021-09-21 DIAGNOSIS — O1410 Severe pre-eclampsia, unspecified trimester: Secondary | ICD-10-CM | POA: Diagnosis not present

## 2021-09-21 DIAGNOSIS — Z3A39 39 weeks gestation of pregnancy: Secondary | ICD-10-CM | POA: Diagnosis not present

## 2021-09-21 DIAGNOSIS — O98219 Gonorrhea complicating pregnancy, unspecified trimester: Secondary | ICD-10-CM | POA: Diagnosis present

## 2021-09-21 DIAGNOSIS — Z8759 Personal history of other complications of pregnancy, childbirth and the puerperium: Secondary | ICD-10-CM | POA: Diagnosis present

## 2021-09-21 HISTORY — DX: Severe pre-eclampsia, unspecified trimester: O14.10

## 2021-09-21 LAB — CBC
HCT: 29.7 % — ABNORMAL LOW (ref 36.0–46.0)
HCT: 31.5 % — ABNORMAL LOW (ref 36.0–46.0)
Hemoglobin: 9.3 g/dL — ABNORMAL LOW (ref 12.0–15.0)
Hemoglobin: 9.8 g/dL — ABNORMAL LOW (ref 12.0–15.0)
MCH: 24.8 pg — ABNORMAL LOW (ref 26.0–34.0)
MCH: 24.6 pg — ABNORMAL LOW (ref 26.0–34.0)
MCHC: 31.3 g/dL (ref 30.0–36.0)
MCHC: 31.1 g/dL (ref 30.0–36.0)
MCV: 79.2 fL — ABNORMAL LOW (ref 80.0–100.0)
MCV: 79.1 fL — ABNORMAL LOW (ref 80.0–100.0)
Platelets: 192 10*3/uL (ref 150–400)
Platelets: 198 10*3/uL (ref 150–400)
RBC: 3.75 MIL/uL — ABNORMAL LOW (ref 3.87–5.11)
RBC: 3.98 MIL/uL (ref 3.87–5.11)
RDW: 17.4 % — ABNORMAL HIGH (ref 11.5–15.5)
RDW: 17.7 % — ABNORMAL HIGH (ref 11.5–15.5)
WBC: 7.7 10*3/uL (ref 4.0–10.5)
WBC: 12.5 10*3/uL — ABNORMAL HIGH (ref 4.0–10.5)
nRBC: 0.5 % — ABNORMAL HIGH (ref 0.0–0.2)
nRBC: 0.2 % (ref 0.0–0.2)

## 2021-09-21 LAB — COMPREHENSIVE METABOLIC PANEL
ALT: 15 U/L (ref 0–44)
ALT: 18 U/L (ref 0–44)
AST: 25 U/L (ref 15–41)
AST: 26 U/L (ref 15–41)
Albumin: 2.5 g/dL — ABNORMAL LOW (ref 3.5–5.0)
Albumin: 2.9 g/dL — ABNORMAL LOW (ref 3.5–5.0)
Alkaline Phosphatase: 141 U/L — ABNORMAL HIGH (ref 38–126)
Alkaline Phosphatase: 157 U/L — ABNORMAL HIGH (ref 38–126)
Anion gap: 8 (ref 5–15)
Anion gap: 10 (ref 5–15)
BUN: <5 mg/dL — ABNORMAL LOW (ref 6–20)
BUN: <5 mg/dL — ABNORMAL LOW (ref 6–20)
CO2: 22 mmol/L (ref 22–32)
CO2: 20 mmol/L — ABNORMAL LOW (ref 22–32)
Calcium: 8.3 mg/dL — ABNORMAL LOW (ref 8.9–10.3)
Calcium: 8.6 mg/dL — ABNORMAL LOW (ref 8.9–10.3)
Chloride: 109 mmol/L (ref 98–111)
Chloride: 107 mmol/L (ref 98–111)
Creatinine, Ser: 0.46 mg/dL (ref 0.44–1.00)
Creatinine, Ser: 0.4 mg/dL — ABNORMAL LOW (ref 0.44–1.00)
GFR, Estimated: >60 mL/min (ref >60)
GFR, Estimated: >60 mL/min (ref >60)
Glucose, Bld: 75 mg/dL (ref 70–99)
Glucose, Bld: 86 mg/dL (ref 70–99)
Potassium: 3 mmol/L — ABNORMAL LOW (ref 3.5–5.1)
Potassium: 3 mmol/L — ABNORMAL LOW (ref 3.5–5.1)
Sodium: 139 mmol/L (ref 135–145)
Sodium: 137 mmol/L (ref 135–145)
Total Bilirubin: 0.9 mg/dL (ref 0.3–1.2)
Total Bilirubin: 1.3 mg/dL — ABNORMAL HIGH (ref 0.3–1.2)
Total Protein: 5.7 g/dL — ABNORMAL LOW (ref 6.5–8.1)
Total Protein: 6.3 g/dL — ABNORMAL LOW (ref 6.5–8.1)

## 2021-09-21 LAB — TYPE AND SCREEN
ABO/RH(D): A POS
Antibody Screen: NEGATIVE

## 2021-09-21 LAB — RESP PANEL BY RT-PCR (FLU A&B, COVID) ARPGX2
Influenza A by PCR: NEGATIVE
Influenza B by PCR: NEGATIVE
SARS Coronavirus 2 by RT PCR: NEGATIVE

## 2021-09-21 LAB — PROTEIN / CREATININE RATIO, URINE
Creatinine, Urine: 89.58 mg/dL
Protein Creatinine Ratio: 0.29 mg/mg{Cre} — ABNORMAL HIGH (ref 0.00–0.15)
Total Protein, Urine: 26 mg/dL

## 2021-09-21 LAB — RPR: RPR Ser Ql: NONREACTIVE

## 2021-09-21 MED ORDER — SOD CITRATE-CITRIC ACID 500-334 MG/5ML PO SOLN: 30.0000 mL | ORAL | Status: DC | PRN

## 2021-09-21 MED ORDER — ONDANSETRON HCL 4 MG/2ML IJ SOLN: 4.0000 mg | Freq: Four times a day (QID) | INTRAMUSCULAR | Status: DC | PRN

## 2021-09-21 MED ORDER — MISOPROSTOL 25 MCG QUARTER TABLET: 25.0000 ug | ORAL_TABLET | ORAL | Status: DC | PRN

## 2021-09-21 MED ORDER — OXYCODONE HCL 5 MG PO TABS
5.0000 mg | ORAL_TABLET | ORAL | Status: DC | PRN
  Administered 2021-09-22: 5 mg via ORAL
  Filled 2021-09-21: qty 1

## 2021-09-21 MED ORDER — OXYTOCIN BOLUS FROM INFUSION
333.0000 mL | Freq: Once | INTRAVENOUS | Status: AC
  Administered 2021-09-21: 23:00:00 333 mL via INTRAVENOUS

## 2021-09-21 MED ORDER — TERBUTALINE SULFATE 1 MG/ML IJ SOLN: 0.2500 mg | Freq: Once | INTRAMUSCULAR | Status: DC | PRN

## 2021-09-21 MED ORDER — LACTATED RINGERS IV SOLN: 500.0000 mL | INTRAVENOUS | Status: DC | PRN

## 2021-09-21 MED ORDER — MAGNESIUM SULFATE BOLUS VIA INFUSION
6.0000 g | Freq: Once | INTRAVENOUS | Status: AC
  Administered 2021-09-21: 21:00:00 6 g via INTRAVENOUS
  Filled 2021-09-21: qty 1000

## 2021-09-21 MED ORDER — LIDOCAINE HCL (PF) 1 % IJ SOLN
30.0000 mL | INTRAMUSCULAR | Status: AC | PRN
  Administered 2021-09-21: 23:00:00 30 mL via SUBCUTANEOUS
  Filled 2021-09-21: qty 30

## 2021-09-21 MED ORDER — LABETALOL HCL 5 MG/ML IV SOLN
40.0000 mg | INTRAVENOUS | Status: DC | PRN
  Administered 2021-09-21: 20:00:00 40 mg via INTRAVENOUS

## 2021-09-21 MED ORDER — LACTATED RINGERS IV SOLN: INTRAVENOUS | Status: DC

## 2021-09-21 MED ORDER — HYDRALAZINE HCL 20 MG/ML IJ SOLN: 10.0000 mg | INTRAMUSCULAR | Status: DC | PRN

## 2021-09-21 MED ORDER — MAGNESIUM SULFATE 40 GM/1000ML IV SOLN
2.0000 g/h | INTRAVENOUS | Status: AC
  Administered 2021-09-21 – 2021-09-22 (×2): 2 g/h via INTRAVENOUS
  Filled 2021-09-21 (×2): qty 1000

## 2021-09-21 MED ORDER — LABETALOL HCL 5 MG/ML IV SOLN: 80.0000 mg | INTRAVENOUS | Status: DC | PRN

## 2021-09-21 MED ORDER — POTASSIUM CHLORIDE CRYS ER 20 MEQ PO TBCR
30.0000 meq | EXTENDED_RELEASE_TABLET | Freq: Two times a day (BID) | ORAL | Status: DC
  Administered 2021-09-21 – 2021-09-23 (×4): 30 meq via ORAL
  Filled 2021-09-21: qty 2
  Filled 2021-09-21: qty 1
  Filled 2021-09-21 (×2): qty 2

## 2021-09-21 MED ORDER — LABETALOL HCL 5 MG/ML IV SOLN
40.0000 mg | INTRAVENOUS | Status: DC | PRN
  Filled 2021-09-21: qty 8

## 2021-09-21 MED ORDER — LABETALOL HCL 5 MG/ML IV SOLN
20.0000 mg | INTRAVENOUS | Status: DC | PRN
  Administered 2021-09-21: 20:00:00 20 mg via INTRAVENOUS
  Filled 2021-09-21: qty 4

## 2021-09-21 MED ORDER — ACETAMINOPHEN 325 MG PO TABS: 650.0000 mg | ORAL_TABLET | ORAL | Status: DC | PRN

## 2021-09-21 MED ORDER — OXYTOCIN-SODIUM CHLORIDE 30-0.9 UT/500ML-% IV SOLN
2.5000 [IU]/h | INTRAVENOUS | Status: DC
  Administered 2021-09-21: 23:00:00 2.5 [IU]/h via INTRAVENOUS

## 2021-09-21 MED ORDER — OXYTOCIN-SODIUM CHLORIDE 30-0.9 UT/500ML-% IV SOLN
1.0000 m[IU]/min | INTRAVENOUS | Status: DC
  Administered 2021-09-21: 16:00:00 2 m[IU]/min via INTRAVENOUS
  Filled 2021-09-21: qty 500

## 2021-09-21 MED ORDER — LABETALOL HCL 5 MG/ML IV SOLN: 20.0000 mg | INTRAVENOUS | Status: DC | PRN

## 2021-09-21 MED ORDER — FENTANYL CITRATE (PF) 100 MCG/2ML IJ SOLN: 50.0000 ug | INTRAMUSCULAR | Status: DC | PRN

## 2021-09-21 NOTE — MAU Note (Signed)
Amy Singh is a 20 y.o. at [redacted]w[redacted]d here in MAU reporting: contractions started at midnight and have been getting worse. States they are every 2 min. No bleeding or LOF. +FM  Onset of complaint: today  Pain score: 8/10  Vitals:   09/21/21 0940  BP: (!) 146/84  Pulse: 80  Resp: 18  Temp: 98 F (36.7 C)  SpO2: 98%     FHT: +FM, EFM applied in room  Lab orders placed from triage: none

## 2021-09-21 NOTE — H&P (Signed)
Amy Singh is a 20 y.o. female G2P0010 with IUP at 103w1d by LMP presenting for labor evaluation and was found to have elevated blood pressures while in MAU. She denies HA, blurry vision, floating spots, RUQ pain. She has a history of GC in pregnancy, fetal demise at 20 weeks. She had elevated blood pressure in the office on Tuesday and now with elevated blood pressure today. She reports positive fetal movement. She denies leakage of fluid or vaginal bleeding.  Prenatal History/Complications: PNC at [facility] Pregnancy complications:  - Past Medical History: Past Medical History:  Diagnosis Date   History of chlamydia    History of molluscum contagiosum    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   Qualifier: Diagnosis of  By: Barbaraann Barthel MD, [place]      Past Surgical History: Past Surgical History:  Procedure Laterality Date   WISDOM TOOTH EXTRACTION      Obstetrical History: OB History     Gravida  2   Para      Term      Preterm      AB  1   Living         SAB  1   IAB      Ectopic      Multiple      Live Births               Social History: Social History   Socioeconomic History   Marital status: Single    Spouse name: Not on file   Number of children: Not on file   Years of education: Not on file   Highest education level: Not on file  Occupational History   Not on file  Tobacco Use   Smoking status: Never   Smokeless tobacco: Never  Vaping Use   Vaping Use: Never used  Substance and Sexual Activity   Alcohol use: No   Drug use: Yes    Types: Marijuana    Comment: 08/18/21   Sexual activity: Yes    Partners: Male    Comment: Pregnant   Other Topics Concern   Not on file  Social History Narrative   Not on file   Social Determinants of Health   Financial Resource Strain: Not on file  Food Insecurity: Not on file  Transportation Needs: Not on file  Physical Activity: Not on file  Stress: Not on file  Social Connections: Not on file    Family  History: Family History  Problem Relation Age of Onset   Hypertension Paternal Grandmother    Diabetes Paternal Grandmother     Allergies: No Known Allergies  Medications Prior to Admission  Medication Sig Dispense Refill Last Dose   pantoprazole (PROTONIX) 20 MG tablet Take 1 tablet (20 mg total) by mouth daily. 30 tablet 2 Past Week   Prenatal Vit-Fe Fumarate-FA (PRENATAL MULTIVITAMIN) TABS tablet Take 1 tablet by mouth daily at 12 noon.   Past Month   aspirin EC 81 MG tablet Take 1 tablet (81 mg total) by mouth daily. (Patient not taking: Reported on 09/08/2021) 60 tablet 2    cyclobenzaprine (FLEXERIL) 10 MG tablet Take 1 tablet (10 mg total) by mouth 3 (three) times daily as needed for muscle spasms. 30 tablet 2 Unknown   ferrous sulfate (FERROUSUL) 325 (65 FE) MG tablet Take 1 tablet (325 mg total) by mouth every other day. (Patient not taking: Reported on 07/26/2021) 30 tablet 3    metoCLOPramide (REGLAN) 10 MG tablet Take 1 tablet (10 mg  total) by mouth 3 (three) times daily before meals. (Patient not taking: Reported on 09/08/2021) 30 tablet 0    ondansetron (ZOFRAN-ODT) 4 MG disintegrating tablet Take 1 tablet (4 mg total) by mouth every 6 (six) hours as needed for nausea. (Patient not taking: Reported on 09/08/2021) 20 tablet 2    potassium chloride SA (KLOR-CON M) 20 MEQ tablet Take 2 tablets (40 mEq total) by mouth daily. 5 tablet 0 More than a month    Review of Systems   Constitutional: Negative for fever and chills Eyes: Negative for visual disturbances Respiratory: Negative for shortness of breath, dyspnea Cardiovascular: Negative for chest pain or palpitations  Gastrointestinal: Negative for vomiting, diarrhea and constipation.  POSITIVE for abdominal pain (contractions) Genitourinary: Negative for dysuria and urgency Musculoskeletal: Negative for back pain, joint pain, myalgias  Neurological: Negative for dizziness and headaches  Blood pressure (!) 157/85, pulse 64,  temperature 98 F (36.7 C), temperature source Oral, resp. rate 18, height 5' 3.5" (1.613 m), weight 106.1 kg, last menstrual period 12/21/2020, SpO2 99 %, unknown if currently breastfeeding. General appearance: alert, cooperative, and no distress Lungs: normal respiratory effort Heart: regular rate and rhythm Abdomen: soft, non-tender; bowel sounds normal Extremities: Homans sign is negative, no sign of DVT DTR's 2+ Presentation: cephalic Fetal monitoring   130 bpm, mod var, present acel, occasional variables Uterine activity  q 4 min (per patient) Dilation: 2.5 Effacement (%): 70 Station: -3 Exam by:: holly flippin rn   Prenatal labs: ABO, Rh: --/--/A POS (12/28 0737) Antibody: NEG (12/28 0956) Rubella: 5.43 (06/20 1520) RPR: NON REACTIVE (12/28 0956)  HBsAg: Negative (06/20 1520)  HIV: Non Reactive (10/13 0920)  GBS: Negative/-- (12/07 0916)  1 hr Glucola passed  Genetic screening  normal Anatomy US normal  Prenatal Transfer Tool  Maternal Diabetes: No Genetic Screening: Normal Maternal Ultrasounds/Referrals: Other: Fetal Ultrasounds or other Referrals:  None Maternal Substance Abuse:  No Significant Maternal Medications:  None Significant Maternal Lab Results: Group B Strep negative  Results for orders placed or performed during the hospital encounter of 09/21/21 (from the past 24 hour(s))  Resp Panel by RT-PCR (Flu A&B, Covid) Nasopharyngeal Swab   Collection Time: 09/21/21  9:56 AM   Specimen: Nasopharyngeal Swab; Nasopharyngeal(NP) swabs in vial transport medium  Result Value Ref Range   SARS Coronavirus 2 by RT PCR NEGATIVE NEGATIVE   Influenza A by PCR NEGATIVE NEGATIVE   Influenza B by PCR NEGATIVE NEGATIVE  CBC   Collection Time: 09/21/21  9:56 AM  Result Value Ref Range   WBC 7.7 4.0 - 10.5 K/uL   RBC 3.75 (L) 3.87 - 5.11 MIL/uL   Hemoglobin 9.3 (L) 12.0 - 15.0 g/dL   HCT 10.6 (L) 26.9 - 48.5 %   MCV 79.2 (L) 80.0 - 100.0 fL   MCH 24.8 (L) 26.0 - 34.0  pg   MCHC 31.3 30.0 - 36.0 g/dL   RDW 46.2 (H) 70.3 - 50.0 %   Platelets 192 150 - 400 K/uL   nRBC 0.5 (H) 0.0 - 0.2 %  RPR   Collection Time: 09/21/21  9:56 AM  Result Value Ref Range   RPR Ser Ql NON REACTIVE NON REACTIVE  Comprehensive metabolic panel   Collection Time: 09/21/21  9:56 AM  Result Value Ref Range   Sodium 139 135 - 145 mmol/L   Potassium 3.0 (L) 3.5 - 5.1 mmol/L   Chloride 109 98 - 111 mmol/L   CO2 22 22 - 32 mmol/L  Glucose, Bld 75 70 - 99 mg/dL   BUN <5 (L) 6 - 20 mg/dL   Creatinine, Ser 4.14 0.44 - 1.00 mg/dL   Calcium 8.3 (L) 8.9 - 10.3 mg/dL   Total Protein 5.7 (L) 6.5 - 8.1 g/dL   Albumin 2.5 (L) 3.5 - 5.0 g/dL   AST 25 15 - 41 U/L   ALT 15 0 - 44 U/L   Alkaline Phosphatase 141 (H) 38 - 126 U/L   Total Bilirubin 0.9 0.3 - 1.2 mg/dL   GFR, Estimated >23 >95 mL/min   Anion gap 8 5 - 15  Protein / creatinine ratio, urine   Collection Time: 09/21/21  9:56 AM  Result Value Ref Range   Creatinine, Urine 89.58 mg/dL   Total Protein, Urine 26 mg/dL   Protein Creatinine Ratio 0.29 (H) 0.00 - 0.15 mg/mg[Cre]  Type and screen   Collection Time: 09/21/21  9:56 AM  Result Value Ref Range   ABO/RH(D) A POS    Antibody Screen NEG    Sample Expiration      09/24/2021,2359 Performed at [facility], [address], [place], [place] [address]     Assessment: Amy Singh is a 20 y.o. G2P0010 with an IUP at [redacted]w[redacted]d presenting for labor and was found to have elevated blood pressures, newly diagnosed GHTN.  Plan: #Labor: begin IOL process #Pain:  Per request #FWB Cat 1 #ID: GBS: neg #MOF:  girl #MOC: pill or Depo  #Circ: NA Pre-e labs pending  Marylene Land 09/21/2021, 11:47 AM

## 2021-09-21 NOTE — Progress Notes (Addendum)
L&D Note  09/21/2021 - 9:30 PM  20 y.o. G2P0010 [redacted]w[redacted]d. Pregnancy complicated by anemia, IUFD in 2nd trimester  Patient Active Problem List   Diagnosis Date Noted   Transient hypertension of pregnancy 09/21/2021   Gestational hypertension 09/21/2021   Severe pre-eclampsia 09/21/2021   BMI 40.0-44.9, adult (HCC) 09/06/2021   Maternal morbid obesity, antepartum (HCC) 07/19/2021   Anemia in pregnancy, third trimester 07/08/2021   Drug use affecting pregnancy 05/15/2021   Alpha thalassemia silent carrier 03/24/2021   Gonorrhea affecting pregnancy 03/16/2021   Chlamydia infection affecting pregnancy 03/16/2021   Supervision of high risk pregnancy, antepartum 02/10/2021   IUFD at less than 20 weeks of gestation 11/18/2020   History of marijuana use 10/20/2020    Ms. Berta Minor is admitted for IOL for gHTN  Patient now with severe pre-eclampsia (BPs)   Subjective:  IV medications given, rpt labs ordered and pt started on mg.  pt asymptomatic Objective:   Vitals:   09/21/21 2036 09/21/21 2050 09/21/21 2055 09/21/21 2101  BP: 138/84 (!) 153/82  (!) 149/84  Pulse: 80 76  85  Resp: (!) 21 17  18   Temp:   97.9 F (36.6 C)   TempSrc:   Axillary   SpO2:      Weight:      Height:        Current Vital Signs 24h Vital Sign Ranges  T 97.9 F (36.6 C) Temp  Avg: 97.9 F (36.6 C)  Min: 97.8 F (36.6 C)  Max: 98 F (36.7 C)  BP (!) 149/84  BP  Min: 135/75  Max: 174/84  HR 85 Pulse  Avg: 78  Min: 64  Max: 89  RR 18 Resp  Avg: 17.8  Min: 16  Max: 21  SaO2 99 %   SpO2  Avg: 98 %  Min: 97 %  Max: 99 %       24 Hour I/O Current Shift I/O  Time Ins Outs No intake/output data recorded. 12/28 1901 - 12/29 0700 In: 1128.6 [P.O.:80; I.V.:1048.6] Out: 0    FHR: 120 baseline, +accels, no decel, mod variability Toco: q3-63m Gen: mild to mod distress with UCs SVE: 8/80/0 AROM>clear fluid  Labs:  Recent Labs  Lab 09/21/21 0956 09/21/21 2033  WBC 7.7 12.5*  HGB 9.3* 9.8*  HCT  29.7* 31.5*  PLT 192 198   Recent Labs  Lab 09/21/21 0956 09/21/21 2033  NA 139 137  K 3.0* 3.0*  CL 109 107  CO2 22 20*  BUN <5* <5*  CREATININE 0.46 0.40*  CALCIUM 8.3* 8.6*  PROT 5.7* 6.3*  BILITOT 0.9 1.3*  ALKPHOS 141* 157*  ALT 15 18  AST 25 26  GLUCOSE 75 86    Medications Current Facility-Administered Medications  Medication Dose Route Frequency Provider Last Rate Last Admin   acetaminophen (TYLENOL) tablet 650 mg  650 mg Oral Q4H PRN 2034, MD       fentaNYL (SUBLIMAZE) injection 50 mcg  50 mcg Intravenous Q1H PRN [place] Bing, MD       labetalol (NORMODYNE) injection 20 mg  20 mg Intravenous PRN [place] Bing, CNM   20 mg at 09/21/21 2003   And   labetalol (NORMODYNE) injection 40 mg  40 mg Intravenous PRN 2004, CNM       And   labetalol (NORMODYNE) injection 80 mg  80 mg Intravenous PRN Marylene Land, CNM       And   hydrALAZINE (APRESOLINE)  injection 10 mg  10 mg Intravenous PRN Marylene Land, CNM       labetalol (NORMODYNE) injection 20 mg  20 mg Intravenous PRN [place] Bing, MD       And   labetalol (NORMODYNE) injection 40 mg  40 mg Intravenous PRN [place] Bing, MD   40 mg at 09/21/21 2018   And   labetalol (NORMODYNE) injection 80 mg  80 mg Intravenous PRN [place] Bing, MD       And   hydrALAZINE (APRESOLINE) injection 10 mg  10 mg Intravenous PRN [place] Bing, MD       lactated ringers infusion 500-1,000 mL  500-1,000 mL Intravenous PRN [place] Bing, MD       lactated ringers infusion   Intravenous Continuous [place] Bing, MD 75 mL/hr at 09/21/21 2040 Rate Change at 09/21/21 2040   lidocaine (PF) (XYLOCAINE) 1 % injection 30 mL  30 mL Subcutaneous PRN [place] Bing, MD       magnesium sulfate 40 grams in SWI 1000 mL OB infusion  2 g/hr Intravenous Continuous [place] Bing, MD 50 mL/hr at 09/21/21 2103 2 g/hr at 09/21/21 2103   misoprostol (CYTOTEC)  tablet 25 mcg  25 mcg Vaginal Q4H PRN [place] Bing, MD       ondansetron (ZOFRAN) injection 4 mg  4 mg Intravenous Q6H PRN [place] Bing, MD       oxytocin (PITOCIN) IV BOLUS FROM BAG  333 mL Intravenous Once [place] Bing, MD       oxytocin (PITOCIN) IV infusion 30 units in NS 500 mL - Premix  2.5 Units/hr Intravenous Continuous [place] Bing, MD       oxytocin (PITOCIN) IV infusion 30 units in NS 500 mL - Premix  1-40 milli-units/min Intravenous Titrated Marylene Land, CNM 6 mL/hr at 09/21/21 1800 6 milli-units/min at 09/21/21 1800   potassium chloride (KLOR-CON M) CR tablet 30 mEq  30 mEq Oral BID [place] Bing, MD       sodium citrate-citric acid (ORACIT) solution 30 mL  30 mL Oral Q2H PRN [place] Bing, MD       terbutaline (BRETHINE) injection 0.25 mg  0.25 mg Subcutaneous Once PRN  Bing, MD       terbutaline (BRETHINE) injection 0.25 mg  0.25 mg Subcutaneous Once PRN Marylene Land, CNM        Assessment & Plan:  Pt doing well *IUP: category I with accels, fetal status reassuring *Severe pre-eclampsia: d/w pt re: disease process and expediting delivery with AROM, which she was amenable to now. Labs normal. Kdur ordered *IOL: continue pitocin *GBS: neg *Analgesia: pt not desiring epidural  Cornelia Copa MD Attending Center for [facility])

## 2021-09-21 NOTE — Progress Notes (Signed)
° °  Amy Singh is a 20 y.o. G2P0010 at [redacted]w[redacted]d  admitted for gHTN  Subjective: Reports pain is an 8/10 but is able to cope; she denies HA, blurry vision, floating spots, RUQ pain.   Objective: Vitals:   09/21/21 1210 09/21/21 1310 09/21/21 1355 09/21/21 1501  BP: 135/75 (!) 143/85 (!) 152/86 (!) 141/85  Pulse: 70 78 82 83  Resp: 18 16 18    Temp:      TempSrc:      SpO2:      Weight:      Height:       No intake/output data recorded.  FHT:  FHR: 135 bpm, variability: moderate,  accelerations:  Present,  decelerations:  Absent UC:   none SVE:   Dilation: 3 Effacement (%): 70 Station: -3 Exam by:: K. Tristin Gladman Pitocin @ 0 mu/min  Labs: Lab Results  Component Value Date   WBC 7.7 09/21/2021   HGB 9.3 (L) 09/21/2021   HCT 29.7 (L) 09/21/2021   MCV 79.2 (L) 09/21/2021   PLT 192 09/21/2021    Assessment / Plan: Still in early labor; will start pitocin  Labor:  will start pitocin after no change in 4 hours Fetal Wellbeing:  Category I Pain Control:  Epidural when patient wants  Anticipated MOD:  NSVD  09/23/2021 Breasia Karges 09/21/2021, 3:04 PM

## 2021-09-22 ENCOUNTER — Encounter (HOSPITAL_COMMUNITY): Payer: Self-pay | Admitting: Obstetrics and Gynecology

## 2021-09-22 LAB — CBC
HCT: 22.6 % — ABNORMAL LOW (ref 36.0–46.0)
Hemoglobin: 7.3 g/dL — ABNORMAL LOW (ref 12.0–15.0)
MCH: 25.5 pg — ABNORMAL LOW (ref 26.0–34.0)
MCHC: 32.3 g/dL (ref 30.0–36.0)
MCV: 79 fL — ABNORMAL LOW (ref 80.0–100.0)
Platelets: 185 10*3/uL (ref 150–400)
RBC: 2.86 MIL/uL — ABNORMAL LOW (ref 3.87–5.11)
RDW: 17.3 % — ABNORMAL HIGH (ref 11.5–15.5)
WBC: 14.7 10*3/uL — ABNORMAL HIGH (ref 4.0–10.5)
nRBC: 0.6 % — ABNORMAL HIGH (ref 0.0–0.2)

## 2021-09-22 LAB — COMPREHENSIVE METABOLIC PANEL
ALT: 15 U/L (ref 0–44)
AST: 23 U/L (ref 15–41)
Albumin: 2.1 g/dL — ABNORMAL LOW (ref 3.5–5.0)
Alkaline Phosphatase: 120 U/L (ref 38–126)
Anion gap: 7 (ref 5–15)
BUN: <5 mg/dL — ABNORMAL LOW (ref 6–20)
CO2: 23 mmol/L (ref 22–32)
Calcium: 7.6 mg/dL — ABNORMAL LOW (ref 8.9–10.3)
Chloride: 108 mmol/L (ref 98–111)
Creatinine, Ser: 0.52 mg/dL (ref 0.44–1.00)
GFR, Estimated: >60 mL/min (ref >60)
Glucose, Bld: 87 mg/dL (ref 70–99)
Potassium: 3.1 mmol/L — ABNORMAL LOW (ref 3.5–5.1)
Sodium: 138 mmol/L (ref 135–145)
Total Bilirubin: 0.9 mg/dL (ref 0.3–1.2)
Total Protein: 4.6 g/dL — ABNORMAL LOW (ref 6.5–8.1)

## 2021-09-22 MED ORDER — ONDANSETRON HCL 4 MG PO TABS: 4.0000 mg | ORAL_TABLET | ORAL | Status: DC | PRN

## 2021-09-22 MED ORDER — TETANUS-DIPHTH-ACELL PERTUSSIS 5-2.5-18.5 LF-MCG/0.5 IM SUSY: 0.5000 mL | PREFILLED_SYRINGE | Freq: Once | INTRAMUSCULAR | Status: DC

## 2021-09-22 MED ORDER — PRENATAL MULTIVITAMIN CH
1.0000 | ORAL_TABLET | Freq: Every day | ORAL | Status: DC
  Administered 2021-09-22 – 2021-09-23 (×2): 1 via ORAL
  Filled 2021-09-22 (×2): qty 1

## 2021-09-22 MED ORDER — ONDANSETRON HCL 4 MG/2ML IJ SOLN: 4.0000 mg | INTRAMUSCULAR | Status: DC | PRN

## 2021-09-22 MED ORDER — WITCH HAZEL-GLYCERIN EX PADS: 1.0000 "application " | MEDICATED_PAD | CUTANEOUS | Status: DC | PRN

## 2021-09-22 MED ORDER — ACETAMINOPHEN 325 MG PO TABS
650.0000 mg | ORAL_TABLET | ORAL | Status: DC | PRN
  Administered 2021-09-23 (×2): 650 mg via ORAL
  Filled 2021-09-22 (×2): qty 2

## 2021-09-22 MED ORDER — COCONUT OIL OIL
1.0000 "application " | TOPICAL_OIL | Status: DC | PRN
  Administered 2021-09-22: 1 via TOPICAL

## 2021-09-22 MED ORDER — LACTATED RINGERS IV SOLN
INTRAVENOUS | Status: DC
Start: 2021-09-22 — End: 2021-09-23

## 2021-09-22 MED ORDER — SIMETHICONE 80 MG PO CHEW
80.0000 mg | CHEWABLE_TABLET | ORAL | Status: DC | PRN
  Filled 2021-09-22: qty 1

## 2021-09-22 MED ORDER — SENNOSIDES-DOCUSATE SODIUM 8.6-50 MG PO TABS: 2.0000 | ORAL_TABLET | Freq: Every evening | ORAL | Status: DC | PRN

## 2021-09-22 MED ORDER — BENZOCAINE-MENTHOL 20-0.5 % EX AERO
1.0000 "application " | INHALATION_SPRAY | CUTANEOUS | Status: DC | PRN
  Administered 2021-09-22: 1 via TOPICAL
  Filled 2021-09-22: qty 56

## 2021-09-22 MED ORDER — TRIAMTERENE-HCTZ 37.5-25 MG PO TABS
1.0000 | ORAL_TABLET | Freq: Every day | ORAL | Status: DC
  Administered 2021-09-22 – 2021-09-23 (×2): 1 via ORAL
  Filled 2021-09-22 (×2): qty 1

## 2021-09-22 MED ORDER — DIBUCAINE (PERIANAL) 1 % EX OINT: 1.0000 "application " | TOPICAL_OINTMENT | CUTANEOUS | Status: DC | PRN

## 2021-09-22 MED ORDER — TRIAMTERENE-HCTZ 37.5-25 MG PO TABS
1.0000 | ORAL_TABLET | Freq: Two times a day (BID) | ORAL | Status: DC
Start: 2021-09-22 — End: 2021-09-22

## 2021-09-22 MED ORDER — IBUPROFEN 600 MG PO TABS
600.0000 mg | ORAL_TABLET | Freq: Four times a day (QID) | ORAL | Status: DC
  Administered 2021-09-22 – 2021-09-23 (×6): 600 mg via ORAL
  Filled 2021-09-22 (×6): qty 1

## 2021-09-22 MED ORDER — DIPHENHYDRAMINE HCL 25 MG PO CAPS: 25.0000 mg | ORAL_CAPSULE | Freq: Four times a day (QID) | ORAL | Status: DC | PRN

## 2021-09-22 NOTE — Lactation Note (Signed)
This note was copied from a baby's chart. Lactation Consultation Note  Patient Name: Amy Singh FVCBS'W Date: 09/22/2021 Reason for consult: Initial assessment;Mother's request;Primapara;1st time breastfeeding;Term;Breastfeeding assistance Age:19 hours  Mom feeding plan breast and bottle, supplementing with formula.  Mom latched with RN, Juluis Mire and supplemented with formula prior to [facility] arrival.  [facility] reviewed different latch positions and how to ensure infant deep latch with mother  Plan 1. To feed based on cues 8-12x 24hr period. Mom to offer breast and look for signs of milk transfer.  2. Mom to supplement with EBM first followed by formula with pace bottle feeding and slow flow nipple. Mom aware if infant not latching to offer more. BF supplementation guide reviewed. 3. DEBP q3 hrs for 15 min   All questions answered at the end of the visit.   Maternal Data Has patient been taught Hand Expression?: Yes Does the patient have breastfeeding experience prior to this delivery?: No  Feeding Mother's Current Feeding Choice: Breast Milk and Formula Nipple Type: Slow - flow  LATCH Score Latch: Grasps breast easily, tongue down, lips flanged, rhythmical sucking.  Audible Swallowing: Spontaneous and intermittent  Type of Nipple: Everted at rest and after stimulation  Comfort (Breast/Nipple): Soft / non-tender  Hold (Positioning): Assistance needed to correctly position infant at breast and maintain latch.  LATCH Score: 9   Lactation Tools Discussed/Used Tools: Pump;Flanges Flange Size: 24 (RN set up pump. Mom states flange size comfortable fit.) Breast pump type: Double-Electric Breast Pump Pump Education: Setup, frequency, and cleaning;Milk Storage Reason for Pumping: increase stimulation Pumping frequency: every 3 hrs for 15 min  Interventions Interventions: Breast feeding basics reviewed;Hand express;Breast compression;Position options;Expressed  milk;DEBP;Education;Pace feeding;LC Psychologist, educational;Infant Driven Feeding Algorithm education  Discharge Pump: Personal WIC Program: Yes  Consult Status Consult Status: Follow-up Date: 09/23/21 Follow-up type: In-patient    Amy Morissette  Singh 09/22/2021, 4:01 PM

## 2021-09-22 NOTE — Progress Notes (Addendum)
Post Partum Day 1 Subjective: no complaints, up ad lib, voiding, and tolerating PO  Objective: Blood pressure (!) 118/47, pulse 81, temperature 97.8 F (36.6 C), temperature source Oral, resp. rate 18, height 5' 3.5" (1.613 m), weight 106.1 kg, last menstrual period 12/21/2020, SpO2 100 %, unknown if currently breastfeeding.  Physical Exam:  General: alert, cooperative, and no distress Lochia: appropriate Uterine Fundus: firm Incision:  DVT Evaluation: No evidence of DVT seen on physical exam.  Recent Labs    09/21/21 2033 09/22/21 0603  HGB 9.8* 7.3*  HCT 31.5* 22.6*  Just received IV venofer on 09/12/21+09/20/21, so will not repeat  Assessment/Plan: Plan for discharge tomorrow and Breastfeeding, evaluate for discharge tomorrow evening, depends on BP Recheck hemoglobin in am Magnesium off at 2300 Begin maxzide instaed of lasix due to her hypokalemia  LOS: 1 day   Lazaro Arms 09/22/2021, 10:08 AM

## 2021-09-22 NOTE — Progress Notes (Signed)
CSW will met with MOB after her magnesium is discontinued.  ° °Caron Ode Boyd-Gilyard, MSW, LCSW °Clinical Social Work °[phone] °

## 2021-09-22 NOTE — Lactation Note (Signed)
This note was copied from a baby's chart. Lactation Consultation Note  Patient Name: Amy Singh VZDGL'O Date: 09/22/2021 Reason for consult: L&D Initial assessment;Mother's request Age:19 hours Mom decided ask for [facility] assistance with latching infant at the breast in L&D. LC entered the room, mom was doing skin to skin with infant, infant was sucking on her thumb. Mom latched infant on her right breast using the football hold position, infant latched with depth , sustaining latch and was still breastfeeding after 12 minutes when LC left the room. Mom knows to breastfeed infant according to primal cues skin to skin. Mom knows to ask RN/LC on OB for further latch assistance if needed.  Maternal Data Does the patient have breastfeeding experience prior to this delivery?: No  Feeding Mother's Current Feeding Choice: Breast Milk and Formula  LATCH Score Latch: Grasps breast easily, tongue down, lips flanged, rhythmical sucking.  Audible Swallowing: Spontaneous and intermittent  Type of Nipple: Everted at rest and after stimulation  Comfort (Breast/Nipple): Soft / non-tender  Hold (Positioning): Assistance needed to correctly position infant at breast and maintain latch.  LATCH Score: 9   Lactation Tools Discussed/Used    Interventions Interventions: Skin to skin;Assisted with latch;Adjust position;Support pillows;Position options;Education  Discharge    Consult Status Consult Status: Follow-up from L&D    Danelle Earthly 09/22/2021, 12:23 AM

## 2021-09-23 ENCOUNTER — Ambulatory Visit: Payer: BC Managed Care – PPO

## 2021-09-23 LAB — CBC WITH DIFFERENTIAL/PLATELET
Abs Immature Granulocytes: 0.09 10*3/uL — ABNORMAL HIGH (ref 0.00–0.07)
Basophils Absolute: 0 10*3/uL (ref 0.0–0.1)
Basophils Relative: 0 %
Eosinophils Absolute: 0.1 10*3/uL (ref 0.0–0.5)
Eosinophils Relative: 1 %
HCT: 22.2 % — ABNORMAL LOW (ref 36.0–46.0)
Hemoglobin: 7 g/dL — ABNORMAL LOW (ref 12.0–15.0)
Immature Granulocytes: 1 %
Lymphocytes Relative: 33 %
Lymphs Abs: 4.3 10*3/uL — ABNORMAL HIGH (ref 0.7–4.0)
MCH: 25.4 pg — ABNORMAL LOW (ref 26.0–34.0)
MCHC: 31.5 g/dL (ref 30.0–36.0)
MCV: 80.4 fL (ref 80.0–100.0)
Monocytes Absolute: 0.8 10*3/uL (ref 0.1–1.0)
Monocytes Relative: 6 %
Neutro Abs: 7.5 10*3/uL (ref 1.7–7.7)
Neutrophils Relative %: 59 %
Platelets: 198 10*3/uL (ref 150–400)
RBC: 2.76 MIL/uL — ABNORMAL LOW (ref 3.87–5.11)
RDW: 17.9 % — ABNORMAL HIGH (ref 11.5–15.5)
WBC: 12.7 10*3/uL — ABNORMAL HIGH (ref 4.0–10.5)
nRBC: 0.6 % — ABNORMAL HIGH (ref 0.0–0.2)

## 2021-09-23 LAB — SURGICAL PATHOLOGY

## 2021-09-23 MED ORDER — TRIAMTERENE-HCTZ 37.5-25 MG PO TABS: 1.0000 | ORAL_TABLET | Freq: Every day | ORAL | 0 refills | Status: DC

## 2021-09-23 MED ORDER — IBUPROFEN 600 MG PO TABS: 600.0000 mg | ORAL_TABLET | Freq: Four times a day (QID) | ORAL | 0 refills | Status: DC

## 2021-09-23 MED ORDER — DROSPIRENONE-ESTETROL 3-14.2 MG PO TABS
1.0000 | ORAL_TABLET | Freq: Every day | ORAL | 12 refills | Status: DC
Start: 2021-09-23 — End: 2021-11-11

## 2021-09-23 NOTE — Lactation Note (Signed)
This note was copied from a baby's chart. Lactation Consultation Note  Patient Name: Amy Singh PZWCH'E Date: 09/23/2021 Reason for consult: Follow-up assessment;1st time breastfeeding;Primapara;Other (Comment);Term;Infant weight loss (teen pregnancy, THC (+)) Age:20 years  Visited with mom of 19 hours old FT female, she's a P1 (had a stillborn) butt his is her first BF. Mom and baby are going home today. Reviewed discharge instructions, engorgement prevention/treatment, sore nipples, lactogenesis II and red flags on when to call baby's pediatrician.  Asked mom how BF is going and she replied she's going to be doing both, breast and formula. Noticed that she's been mostly doing formula while at the hospital. Revised breastmilk Vs formula storage guidelines and advised mom to pump whenever baby is getting a bottle to protect her supply.  Encouraged putting baby to breast 8-12 times/24 hours or sooner if feeding cues are present; parents plan to continue supplementing with Enfamil 20 calorie formula; supplementation guidelines were also revised. FOB present, all questions and concerns answered, family to contact LC PRN.  Feeding Mother's Current Feeding Choice: Breast Milk and Formula Nipple Type: Regular  Lactation Tools Discussed/Used Tools: Pump Flange Size: 24 Breast pump type: Double-Electric Breast Pump Pump Education: Setup, frequency, and cleaning;Milk Storage Reason for Pumping: induction of lactation Pumping frequency: whenever baby is getting formula Pumped volume:  (drops)  Interventions Interventions: Breast feeding basics reviewed;DEBP;Education  Discharge Discharge Education: Engorgement and breast care;Warning signs for feeding baby Pump: DEBP;Personal (DEBP for home use)  Consult Status Consult Status: Complete Date: 09/23/21 Follow-up type: Call as needed   Burnetta Kohls Venetia Constable 09/23/2021, 12:54 PM

## 2021-09-23 NOTE — Discharge Summary (Signed)
Postpartum Discharge Summary      Patient Name: Amy Singh DOB: [birth year] MRN: [id number]  Date of admission: 09/21/2021 Delivery date:09/21/2021  Delivering provider: Aletha Halim  Date of discharge: 09/23/2021  Admitting diagnosis: Transient hypertension of pregnancy [O13.9] Intrauterine pregnancy: [redacted]w[redacted]d    Secondary diagnosis:  Principal Problem:   Transient hypertension of pregnancy Active Problems:   IUFD at less than 20 weeks of gestation   Supervision of high risk pregnancy, antepartum   Gonorrhea affecting pregnancy   Chlamydia infection affecting pregnancy   Gestational hypertension   Severe pre-eclampsia  Additional problems: hypokalemia    Discharge diagnosis: Term Pregnancy Delivered, Gestational Hypertension, and super imposed severe pre eclampsia                                               Post partum procedures: magnesium Augmentation: AROM, Pitocin, Cytotec, and IP Foley Complications: None  Hospital course:  Admitted for IOL due to BP, hx 20 week demise IOL was uncomplicated Delivered vaginally about 12 hours after starting, unremarkable except left vulvovaginal laceration, repeaired PP magnesium Begun on maxzide 75/50 for fluid management in hypertensive disorder No additional BP meds needed Discharge PPD#2  Magnesium Sulfate received: Yes: Seizure prophylaxis BMZ received: No Rhophylac:N/A MMR:N/A T-DaP:Given postpartum Flu: No Transfusion:No  Physical exam  Vitals:   09/22/21 1952 09/22/21 2308 09/23/21 0434 09/23/21 0934  BP: (!) 130/49 129/60 (!) 110/56 128/70  Pulse: 76 83 63 68  Resp: '16  16 18  ' Temp: 98 F (36.7 C)  97.6 F (36.4 C) 98.2 F (36.8 C)  TempSrc: Oral  Oral Oral  SpO2:   99% 100%  Weight:      Height:       General: alert, cooperative, and no distress Lochia: appropriate Uterine Fundus: firm Incision: N/A DVT Evaluation: No evidence of DVT seen on physical exam. Labs: Lab Results  Component  Value Date   WBC 12.7 (H) 09/23/2021   HGB 7.0 (L) 09/23/2021   HCT 22.2 (L) 09/23/2021   MCV 80.4 09/23/2021   PLT 198 09/23/2021   CMP Latest Ref Rng & Units 09/22/2021  Glucose 70 - 99 mg/dL 87  BUN 6 - 20 mg/dL <5(L)  Creatinine 0.44 - 1.00 mg/dL 0.52  Sodium 135 - 145 mmol/L 138  Potassium 3.5 - 5.1 mmol/L 3.1(L)  Chloride 98 - 111 mmol/L 108  CO2 22 - 32 mmol/L 23  Calcium 8.9 - 10.3 mg/dL 7.6(L)  Total Protein 6.5 - 8.1 g/dL 4.6(L)  Total Bilirubin 0.3 - 1.2 mg/dL 0.9  Alkaline Phos 38 - 126 U/L 120  AST 15 - 41 U/L 23  ALT 0 - 44 U/L 15   Edinburgh Score: Edinburgh Postnatal Depression Scale Screening Tool 09/23/2021  I have been able to laugh and see the funny side of things. 0  I have looked forward with enjoyment to things. 0  I have blamed myself unnecessarily when things went wrong. 1  I have been anxious or worried for no good reason. 0  I have felt scared or panicky for no good reason. 0  Things have been getting on top of me. 1  I have been so unhappy that I have had difficulty sleeping. 0  I have felt sad or miserable. 1  I have been so unhappy that I have been crying. 0  The thought of harming myself has occurred to me. 0  Edinburgh Postnatal Depression Scale Total 3      After visit meds:  Allergies as of 09/23/2021   No Known Allergies      Medication List     STOP taking these medications    aspirin EC 81 MG tablet   ferrous sulfate 325 (65 FE) MG tablet Commonly known as: FerrouSul   metoCLOPramide 10 MG tablet Commonly known as: REGLAN   ondansetron 4 MG disintegrating tablet Commonly known as: ZOFRAN-ODT   pantoprazole 20 MG tablet Commonly known as: Protonix   potassium chloride SA 20 MEQ tablet Commonly known as: KLOR-CON M       TAKE these medications    cyclobenzaprine 10 MG tablet Commonly known as: FLEXERIL Take 1 tablet (10 mg total) by mouth 3 (three) times daily as needed for muscle spasms.    Drospirenone-Estetrol 3-14.2 MG Tabs Take 1 tablet by mouth daily.   ibuprofen 600 MG tablet Commonly known as: ADVIL Take 1 tablet (600 mg total) by mouth every 6 (six) hours.   prenatal multivitamin Tabs tablet Take 1 tablet by mouth daily at 12 noon.   triamterene-hydrochlorothiazide 37.5-25 MG tablet Commonly known as: MAXZIDE-25 Take 1 tablet by mouth daily. Start taking on: September 24, 2021         Discharge home in stable condition Infant Feeding: Breast Infant Disposition:home with mother Discharge instruction: per After Visit Summary and Postpartum booklet. Activity: Advance as tolerated. Pelvic rest for 6 weeks.  Diet: routine diet Anticipated Birth Control: OCPs Postpartum Appointment:1 week Additional Postpartum F/U: BP check 1 week Future Appointments:No future appointments. Follow up Visit:  [facility] Follow up in 1 week(s).   Specialty: Obstetrics and Gynecology Why: BP nurse visit Contact information: [address] [place]                    09/23/2021 Florian Buff, MD

## 2021-09-23 NOTE — Clinical Social Work Maternal (Signed)
CLINICAL SOCIAL WORK MATERNAL/CHILD NOTE  Patient Details  Name: Amy Singh MRN: [id number] Date of Birth: 07-31-2001  Date:  09/23/2021  Clinical Social Worker Initiating Note:  Abundio Miu, [place] Date/Time: Initiated:  09/23/21/1233     Child's Name:  OF'BPZWC [place]   Biological Parents:  Mother, Father (Father: Selena Batten 06/09/97)   Need for Interpreter:  None   Reason for Referral:  Current Substance Use/Substance Use During Pregnancy     Address:  [address] [place], [place] [address]   Phone number:  [phone] (home)     Additional phone number:   Household Members/Support Persons (HM/SP):       HM/SP Name Relationship DOB or Age  HM/SP -1        HM/SP -2        HM/SP -3        HM/SP -4        HM/SP -5        HM/SP -6        HM/SP -7        HM/SP -8          Natural Supports (not living in the home):  Immediate Family, Parent, Spouse/significant other   Professional Supports: None   Employment: Animator   Type of Work:     Education:  [place] arranged:    Museum/gallery curator Resources:  Multimedia programmer    Other Resources:  [employer], Physicist, medical     Cultural/Religious Considerations Which May Impact Care:    Strengths:  Ability to meet basic needs  , Engineer, materials, Home prepared for child     Psychotropic Medications:         Pediatrician:    Solicitor area  Pediatrician List:   [facility]  [place]      Pediatrician Fax Number:    Risk Factors/Current Problems:  Substance Use     Cognitive State:  Able to Concentrate  , Alert  , Goal Oriented  , Insightful  , Linear Thinking     Mood/Affect:  Calm  , Happy  , Interested  , Comfortable     CSW Assessment: CSW met with MOB at bedside to complete psychosocial assessment, FOB present. MOB granted CSW verbal permission to speak front of  FOB about anything. CSW introduced self and explained role. MOB was welcoming, pleasant, open, and remained engaged during assessment. 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 MOB reported that she receives both [facility] and food stamps. MOB reported that she has all items needed to care for infant including a car seat and basinet. CSW inquired about MOB's support system, MOB reported that her mom, dad, FOB, sister and grandma are supports.   CSW inquired about MOB's mental health history. MOB denied any mental health history. CSW inquired about how MOB was feeling emotionally since giving birth, MOB reported that she was feeling good. MOB presented calm and did not demonstrate any acute mental health signs/symptoms. CSW assessed for safety, MOB denied SI and HI. CSW did not assess for domestic violence as FOB was present.   CSW provided education regarding the baby blues period vs. perinatal mood disorders, discussed treatment and gave resources for mental health follow up if concerns arise.  CSW recommends self-evaluation during the postpartum time period using the New Mom  Checklist from Postpartum Progress and encouraged MOB to contact a medical professional if symptoms are noted at any time.    CSW provided review of Sudden Infant Death Syndrome (SIDS) precautions.    CSW informed MOB about the hospital drug screen policy due to substance use during pregnancy. MOB confirmed marijuana use during pregnancy and reported last use as 1 or 2 weeks ago due to nausea. MOB denied any other substance use during pregnancy. CSW informed MOB that infant's UDS was positive for THC (marijuana) and that a CPS report would be made. MOB verbalized understanding and asked appropriate questions.   CSW made a CPS report to [facility] CPS due to infant's positive UDS for THC. CPS to follow up within 72 hours.   CSW identifies no further need for intervention and no barriers to discharge at  this time.   CSW Plan/Description:  No Further Intervention Required/No Barriers to Discharge, Sudden Infant Death Syndrome (SIDS) Education, Perinatal Mood and Anxiety Disorder (PMADs) Education, Child Protective Service Report  , [place], LCSW 09/23/2021, 12:37 PM

## 2021-09-24 ENCOUNTER — Inpatient Hospital Stay (HOSPITAL_COMMUNITY): Payer: BC Managed Care – PPO

## 2021-09-29 ENCOUNTER — Ambulatory Visit: Payer: BC Managed Care – PPO

## 2021-10-04 ENCOUNTER — Telehealth (HOSPITAL_COMMUNITY): Payer: Self-pay | Admitting: *Deleted

## 2021-10-04 ENCOUNTER — Ambulatory Visit (INDEPENDENT_AMBULATORY_CARE_PROVIDER_SITE_OTHER): Payer: BC Managed Care – PPO

## 2021-10-04 ENCOUNTER — Other Ambulatory Visit: Payer: Self-pay

## 2021-10-04 VITALS — BP 122/78 | HR 77 | Wt 192.0 lb

## 2021-10-04 DIAGNOSIS — F53 Postpartum depression: Secondary | ICD-10-CM | POA: Diagnosis not present

## 2021-10-04 NOTE — Telephone Encounter (Signed)
No phone call placed. Patient seen by provider today. Noted in chart, EPDS today = 20, and appointment scheduled for 1/11 with [facility]. Deforest Hoyles, RN, 10/04/21, 1718.

## 2021-10-04 NOTE — Progress Notes (Signed)
Amy Singh  [phone] here for postpartum depression screen. Pt is currently 1 weeks postpartum. Pt reports doing EPDS = 20.   Edinburgh Postnatal Depression Scale - 10/04/21 1139       Edinburgh Postnatal Depression Scale:  In the Past 7 Days   I have been able to laugh and see the funny side of things. 1    I have looked forward with enjoyment to things. 1    I have blamed myself unnecessarily when things went wrong. 2    I have been anxious or worried for no good reason. 3    I have felt scared or panicky for no good reason. 3    Things have been getting on top of me. 2    I have been so unhappy that I have had difficulty sleeping. 2    I have felt sad or miserable. 3    I have been so unhappy that I have been crying. 3    The thought of harming myself has occurred to me. 0    Edinburgh Postnatal Depression Scale Total 20               Discussed with provider results of Edinburgh Dr.Anyanwu.  Pt to follow up Appoint will be scheduled for behavioral health.  Subjective:  Amy Singh is a 21 y.o. female here for BP check.   Hypertension ROS: Patient denies any headaches, visual symptoms, RUQ/epigastric pain or other concerning symptoms.  Objective:  BP 122/78    Pulse 77    Wt 192 lb (87.1 kg)    Breastfeeding Yes Comment: Both   BMI 33.48 kg/m   Appearance alert, well appearing, and in no distress. General exam BP noted to be stable today in office.    Assessment:   Blood Pressure stable.   Plan:  Keep scheduled PP appointment. Marland Kitchen

## 2021-10-05 ENCOUNTER — Encounter: Payer: BC Managed Care – PPO | Admitting: Licensed Clinical Social Worker

## 2021-10-05 NOTE — Progress Notes (Signed)
Patient was assessed and managed by nursing staff during this encounter. I have reviewed the chart and agree with the documentation and plan.  ° °Carmelo Reidel, MD °10/05/2021 9:06 AM °

## 2021-10-07 ENCOUNTER — Telehealth: Payer: Self-pay | Admitting: Licensed Clinical Social Worker

## 2021-10-07 ENCOUNTER — Institutional Professional Consult (permissible substitution): Payer: BC Managed Care – PPO | Admitting: Licensed Clinical Social Worker

## 2021-10-07 NOTE — Telephone Encounter (Signed)
Called pt left message regarding mychart visit

## 2021-10-21 ENCOUNTER — Encounter (HOSPITAL_COMMUNITY): Payer: BC Managed Care – PPO

## 2021-11-02 ENCOUNTER — Encounter: Payer: Self-pay | Admitting: Obstetrics & Gynecology

## 2021-11-02 ENCOUNTER — Other Ambulatory Visit: Payer: Self-pay

## 2021-11-02 ENCOUNTER — Ambulatory Visit (INDEPENDENT_AMBULATORY_CARE_PROVIDER_SITE_OTHER): Payer: BC Managed Care – PPO | Admitting: Obstetrics & Gynecology

## 2021-11-02 DIAGNOSIS — O1414 Severe pre-eclampsia complicating childbirth: Secondary | ICD-10-CM

## 2021-11-02 NOTE — Progress Notes (Signed)
Post Partum Visit Note  Amy Singh is a 21 y.o. G60P1011 female who presents for a postpartum visit. She is 6 weeks postpartum following a normal spontaneous vaginal delivery.  I have fully reviewed the prenatal and intrapartum course. The delivery was at 39.1 gestational weeks.  Anesthesia: none. Postpartum course has been uneventful. Baby is doing well. Baby is feeding by both breast and bottle - [employer] Good Start. Bleeding no bleeding. Bowel function is normal. Bladder function is normal. Patient is sexually active. Contraception method is OCP (estrogen/progesterone), but has not started this yet. Postpartum depression screening: positive score 9, already in contact with [facility].   The pregnancy intention screening data noted above was reviewed. Potential methods of contraception were discussed. The patient elected to proceed with OCPs as prescribed.   Edinburgh Postnatal Depression Scale - 11/02/21 1125       Edinburgh Postnatal Depression Scale:  In the Past 7 Days   I have been able to laugh and see the funny side of things. 0    I have looked forward with enjoyment to things. 1    I have blamed myself unnecessarily when things went wrong. 2    I have been anxious or worried for no good reason. 1    I have felt scared or panicky for no good reason. 1    Things have been getting on top of me. 1    I have been so unhappy that I have had difficulty sleeping. 1    I have felt sad or miserable. 1    I have been so unhappy that I have been crying. 1    The thought of harming myself has occurred to me. 0    Edinburgh Postnatal Depression Scale Total 9             Health Maintenance Due  Topic Date Due   COVID-19 Vaccine (1) Never done   HPV VACCINES (1 - 2-dose series) Never done    The following portions of the patient's history were reviewed and updated as appropriate: allergies, current medications, past family history, past medical history, past social history, past  surgical history, and problem list.  Review of Systems Pertinent items noted in HPI and remainder of comprehensive ROS otherwise negative.  Objective:  BP 132/73    Pulse 80    Wt 194 lb (88 kg)    BMI 33.83 kg/m    General:  alert and no distress   Breasts:  normal  Lungs: clear to auscultation bilaterally  Heart:  regular rate and rhythm  Abdomen: soft, non-tender; bowel sounds normal; no masses,  no organomegaly        Assessment:   Normal postpartum exam.   Plan:   Essential components of care per ACOG recommendations:  1.  Mood and well being: Patient with negative depression screening today but botherline. Reviewed local resources for support. Will get another appointment with [facility] soon.  - Patient tobacco use? No.   - hx of drug use? Has used marijuana in the past.    2. Infant care and feeding:  -Patient currently breastmilk feeding? Yes. Reviewed importance of draining breast regularly to support lactation.  -Social determinants of health (SDOH) reviewed in EPIC. No concerns.  3. Sexuality, contraception and birth spacing - Patient does not want a pregnancy in the next year.  Desired family size is unknown.  - Reviewed forms of contraception in tiered fashion. Patient desired oral contraceptives (estrogen/progesterone). - Due for  period in the next week, will start OCPs as directed then. If no period, advised to take home pregnancy test.   - Discussed birth spacing of 18 months  4. Sleep and fatigue -Encouraged family/partner/community support of 4 hrs of uninterrupted sleep to help with mood and fatigue  5. Physical Recovery  - Discussed patients delivery and complications. She describes her labor as good. - Patient had a Vaginal, no problems at delivery. Patient had a sulcus laceration. Perineal healing reviewed. Patient expressed understanding - Patient has urinary incontinence? No. - Patient is safe to resume physical and sexual activity  6.  Health  Maintenance - HM due items addressed Yes - Too young for pap  7. Chronic Disease/Pregnancy Condition follow up:  Severe preeclampsia - Stable BP, no medications - PCP follow up encouraged   Jaynie Collins, MD Center for [facility], [facility]

## 2021-11-11 ENCOUNTER — Ambulatory Visit (HOSPITAL_COMMUNITY)
Admission: EM | Admit: 2021-11-11 | Discharge: 2021-11-11 | Disposition: A | Discharge status: Home / Self Care | Payer: BC Managed Care – PPO | Attending: Student | Admitting: Student

## 2021-11-11 DIAGNOSIS — K047 Periapical abscess without sinus: Secondary | ICD-10-CM | POA: Diagnosis not present

## 2021-11-11 MED ORDER — IBUPROFEN 600 MG PO TABS: 600.0000 mg | ORAL_TABLET | Freq: Four times a day (QID) | ORAL | 0 refills | Status: DC | PRN

## 2021-11-11 MED ORDER — AMOXICILLIN 875 MG PO TABS: 875.0000 mg | ORAL_TABLET | Freq: Two times a day (BID) | ORAL | 0 refills | Status: AC

## 2021-11-11 NOTE — ED Triage Notes (Signed)
Pt presents with oral swelling x 3 days.

## 2021-11-11 NOTE — Discharge Instructions (Addendum)
-  Amoxicillin twice daily x7 days -You can take Tylenol up to 1000 mg 3 times daily, and ibuprofen up to 600 mg 3-4 times daily with food.  You can take these together, or alternate every 3-4 hours. -Follow-up with dentist as scheduled 2/22 -Seek additional immediate medical attention if new symptoms like pain under your tongue, pain under jaw, new fevers, weird tastes in your mouth, etc.

## 2021-11-11 NOTE — ED Provider Notes (Signed)
[place]    CSN: [id number] Arrival date & time: 11/11/21  1011      History   Chief Complaint Chief Complaint  Patient presents with   Oral Swelling    HPI Amy Singh is a 21 y.o. female presenting with dental pain. Medical history recent pregnancy. 3 days of right-sided dental pain with some associated cheek swelling. Ibuprofen providing relief.  Denies foul taste in mouth, pain under tongue, pain under jaw, fever/chills.  She did give birth 2 months ago, she is breast-feeding some. Dentist appt scheduled for 2/22.  HPI  Past Medical History:  Diagnosis Date   Alpha thalassemia silent carrier 03/24/2021   On horizon 4   Chlamydia infection affecting pregnancy 03/16/2021   neg toc 04/2021   Gonorrhea affecting pregnancy 03/16/2021   [x ] toc late July: neg toc    History of chlamydia    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   History of molluscum contagiosum    IUFD at less than 20 weeks of gestation 11/18/2020   Neg lupus anti-coag, anti-phospholipid antibodies, anti-cardiolipin, anti-beta2 glycoprotein    Severe pre-eclampsia 09/21/2021   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   Qualifier: Diagnosis of  By: Radene Ou MD, [place]      Patient Active Problem List   Diagnosis Date Noted   BMI 40.0-44.9, adult ([place]) 09/06/2021    Past Surgical History:  Procedure Laterality Date   WISDOM TOOTH EXTRACTION      OB History     Gravida  2   Para  1   Term  1   Preterm      AB  1   Living  1      SAB  1   IAB      Ectopic      Multiple  0   Live Births  1            Home Medications    Prior to Admission medications   Medication Sig Start Date End Date Taking? Authorizing Provider  amoxicillin (AMOXIL) 875 MG tablet Take 1 tablet (875 mg total) by mouth 2 (two) times daily for 7 days. 11/11/21 11/18/21 Yes Hazel Sams, PA-C  ibuprofen (ADVIL) 600 MG tablet Take 1 tablet (600 mg total) by mouth every 6 (six) hours as needed. 11/11/21   Yes Hazel Sams, PA-C    Family History Family History  Problem Relation Age of Onset   Hypertension Paternal Grandmother    Diabetes Paternal Grandmother     Social History Social History   Tobacco Use   Smoking status: Never   Smokeless tobacco: Never  Vaping Use   Vaping Use: Never used  Substance Use Topics   Alcohol use: No   Drug use: Yes    Types: Marijuana    Comment: 08/18/21     Allergies   Patient has no known allergies.   Review of Systems Review of Systems  HENT:  Positive for dental problem.   All other systems reviewed and are negative.   Physical Exam Triage Vital Signs ED Triage Vitals [11/11/21 1034]  Enc Vitals Group     BP 134/79     Pulse Rate 80     Resp 16     Temp 98.1 F (36.7 C)     Temp Source Oral     SpO2 100 %     Weight      Height      Head Circumference  Peak Flow      Pain Score 8     Pain Loc      Pain Edu?      Excl. in [place]?    No data found.  Updated Vital Signs BP 134/79 (BP Location: Left Arm)    Pulse 80    Temp 98.1 F (36.7 C) (Oral)    Resp 16    SpO2 100%    Breastfeeding Unknown   Visual Acuity Right Eye Distance:   Left Eye Distance:   Bilateral Distance:    Right Eye Near:   Left Eye Near:    Bilateral Near:     Physical Exam Vitals reviewed.  Constitutional:      General: She is not in acute distress.    Appearance: Normal appearance. She is not ill-appearing, toxic-appearing or diaphoretic.  HENT:     Head: Normocephalic and atraumatic.     Jaw: There is normal jaw occlusion. No trismus, tenderness, swelling, pain on movement or malocclusion.     Salivary Glands: Right salivary gland is not diffusely enlarged or tender. Left salivary gland is not diffusely enlarged or tender.     Right Ear: Hearing normal.     Left Ear: Hearing normal.     Nose: Nose normal.     Mouth/Throat:     Lips: Pink.     Mouth: Mucous membranes are moist. No lacerations or oral lesions.     Dentition:  Abnormal dentition. Does not have dentures. Dental tenderness and dental caries present.     Tongue: No lesions. Tongue does not deviate from midline.     Palate: No mass.     Pharynx: Oropharynx is clear. Uvula midline. No oropharyngeal exudate or posterior oropharyngeal erythema.     Tonsils: No tonsillar exudate or tonsillar abscesses.     Comments: Some gingival tenderness surrounding the R upper molars. Some corresponding local R cheek swelling that is minimally tender. No trismus, drooling, sore throat, voice changes, swelling underneath the tongue, swelling underneath the jaw, neck stiffness.  Eyes:     Extraocular Movements: Extraocular movements intact.     Pupils: Pupils are equal, round, and reactive to light.  Pulmonary:     Effort: Pulmonary effort is normal.  Neurological:     General: No focal deficit present.     Mental Status: She is alert and oriented to person, place, and time.  Psychiatric:        Mood and Affect: Mood normal.        Behavior: Behavior normal.        Thought Content: Thought content normal.        Judgment: Judgment normal.     UC Treatments / Results  Labs (all labs ordered are listed, but only abnormal results are displayed) Labs Reviewed - No data to display  EKG   Radiology No results found.  Procedures Procedures (including critical care time)  Medications Ordered in UC Medications - No data to display  Initial Impression / Assessment and Plan / UC Course  I have reviewed the triage vital signs and the nursing notes.  Pertinent labs & imaging results that were available during my care of the patient were reviewed by me and considered in my medical decision making (see chart for details).     This patient is a very pleasant 21 y.o. year old female presenting with dental infection. Afebrile, nontachy. Recent pregnancy; will manage with amoxicillin as she is breastfeeding. Also sent ibuprofen for relief.  F/u with dentist as  scheduled on 11/16/21. ED return precautions discussed. Patient verbalizes understanding and agreement.    Final Clinical Impressions(s) / UC Diagnoses   Final diagnoses:  Dental infection  Lactating mother     Discharge Instructions      -Amoxicillin twice daily x7 days -You can take Tylenol up to 1000 mg 3 times daily, and ibuprofen up to 600 mg 3-4 times daily with food.  You can take these together, or alternate every 3-4 hours. -Follow-up with dentist as scheduled 2/22 -Seek additional immediate medical attention if new symptoms like pain under your tongue, pain under jaw, new fevers, weird tastes in your mouth, etc.      ED Prescriptions     Medication Sig Dispense Auth. Provider   amoxicillin (AMOXIL) 875 MG tablet Take 1 tablet (875 mg total) by mouth 2 (two) times daily for 7 days. 14 tablet Hazel Sams, PA-C   ibuprofen (ADVIL) 600 MG tablet Take 1 tablet (600 mg total) by mouth every 6 (six) hours as needed. 30 tablet Hazel Sams, PA-C      PDMP not reviewed this encounter.   Hazel Sams, PA-C 11/11/21 1217

## 2021-11-14 ENCOUNTER — Institutional Professional Consult (permissible substitution): Payer: BC Managed Care – PPO | Admitting: Licensed Clinical Social Worker

## 2021-11-14 ENCOUNTER — Encounter: Payer: Self-pay | Admitting: Obstetrics & Gynecology

## 2021-11-25 ENCOUNTER — Other Ambulatory Visit: Payer: Self-pay | Admitting: *Deleted

## 2021-11-25 MED ORDER — SLYND 4 MG PO TABS
4.0000 mg | ORAL_TABLET | Freq: Every day | ORAL | 11 refills | Status: DC
Start: 2021-11-25 — End: 2021-11-25

## 2021-11-25 MED ORDER — SLYND 4 MG PO TABS: 4.0000 mg | ORAL_TABLET | Freq: Every day | ORAL | 11 refills | Status: DC

## 2021-11-30 ENCOUNTER — Telehealth: Payer: Self-pay | Admitting: Obstetrics & Gynecology

## 2021-11-30 NOTE — Telephone Encounter (Signed)
Telephone call to patient to reschedule her missed appointment with Sue Lush.  Left message for patient to call [facility] for scheduling.  She has had two no show appointments with Sue Lush.  ? ?

## 2022-01-26 DIAGNOSIS — Z30017 Encounter for initial prescription of implantable subdermal contraceptive: Secondary | ICD-10-CM | POA: Diagnosis not present

## 2022-08-28 ENCOUNTER — Ambulatory Visit (INDEPENDENT_AMBULATORY_CARE_PROVIDER_SITE_OTHER): Payer: BC Managed Care – PPO | Admitting: *Deleted

## 2022-08-28 VITALS — BP 99/63 | HR 59

## 2022-08-28 DIAGNOSIS — N912 Amenorrhea, unspecified: Secondary | ICD-10-CM

## 2022-08-28 LAB — POCT URINE PREGNANCY: Preg Test, Ur: POSITIVE — AB

## 2022-08-28 NOTE — Progress Notes (Signed)
Amy Singh here for a UPT. Pt had a positive upt at home. LMP is 07/25/22 (approx).     UPT in office Positive.    Reviewed medications and informed to start a PNV, if not already. Pt to follow up in 4 for OB interview and then 6 weeks for New OB visit.   Scheryl Marten, RN

## 2022-08-29 ENCOUNTER — Other Ambulatory Visit: Payer: Self-pay | Admitting: *Deleted

## 2022-08-29 MED ORDER — SCOPOLAMINE 1 MG/3DAYS TD PT72: 1.0000 | MEDICATED_PATCH | TRANSDERMAL | 12 refills | Status: DC

## 2022-08-29 NOTE — Progress Notes (Signed)
Pt called asking pt for meds for nausea and stated the patch worked well for her

## 2022-09-12 ENCOUNTER — Encounter: Payer: Self-pay | Admitting: Obstetrics and Gynecology

## 2022-09-15 ENCOUNTER — Encounter (HOSPITAL_COMMUNITY): Payer: Self-pay | Admitting: *Deleted

## 2022-09-15 ENCOUNTER — Inpatient Hospital Stay (HOSPITAL_COMMUNITY)
Admission: AD | Admit: 2022-09-15 | Discharge: 2022-09-15 | Disposition: A | Discharge status: Home / Self Care | Payer: BC Managed Care – PPO | Attending: Obstetrics & Gynecology | Admitting: Obstetrics & Gynecology

## 2022-09-15 DIAGNOSIS — O210 Mild hyperemesis gravidarum: Secondary | ICD-10-CM | POA: Diagnosis not present

## 2022-09-15 DIAGNOSIS — R112 Nausea with vomiting, unspecified: Secondary | ICD-10-CM

## 2022-09-15 DIAGNOSIS — F1290 Cannabis use, unspecified, uncomplicated: Secondary | ICD-10-CM | POA: Insufficient documentation

## 2022-09-15 DIAGNOSIS — O26891 Other specified pregnancy related conditions, first trimester: Secondary | ICD-10-CM | POA: Diagnosis not present

## 2022-09-15 DIAGNOSIS — O99321 Drug use complicating pregnancy, first trimester: Secondary | ICD-10-CM | POA: Insufficient documentation

## 2022-09-15 DIAGNOSIS — O09291 Supervision of pregnancy with other poor reproductive or obstetric history, first trimester: Secondary | ICD-10-CM | POA: Insufficient documentation

## 2022-09-15 DIAGNOSIS — R109 Unspecified abdominal pain: Secondary | ICD-10-CM | POA: Diagnosis not present

## 2022-09-15 DIAGNOSIS — Z3A01 Less than 8 weeks gestation of pregnancy: Secondary | ICD-10-CM | POA: Diagnosis not present

## 2022-09-15 LAB — CBC
HCT: 39.4 % (ref 36.0–46.0)
Hemoglobin: 13.3 g/dL (ref 12.0–15.0)
MCH: 26.7 pg (ref 26.0–34.0)
MCHC: 33.8 g/dL (ref 30.0–36.0)
MCV: 79.1 fL — ABNORMAL LOW (ref 80.0–100.0)
Platelets: 232 10*3/uL (ref 150–400)
RBC: 4.98 MIL/uL (ref 3.87–5.11)
RDW: 14.8 % (ref 11.5–15.5)
WBC: 15.5 10*3/uL — ABNORMAL HIGH (ref 4.0–10.5)
nRBC: 0 % (ref 0.0–0.2)

## 2022-09-15 LAB — BASIC METABOLIC PANEL
Anion gap: 10 (ref 5–15)
BUN: 7 mg/dL (ref 6–20)
CO2: 23 mmol/L (ref 22–32)
Calcium: 9.5 mg/dL (ref 8.9–10.3)
Chloride: 103 mmol/L (ref 98–111)
Creatinine, Ser: 0.65 mg/dL (ref 0.44–1.00)
GFR, Estimated: >60 mL/min (ref >60)
Glucose, Bld: 104 mg/dL — ABNORMAL HIGH (ref 70–99)
Potassium: 3.2 mmol/L — ABNORMAL LOW (ref 3.5–5.1)
Sodium: 136 mmol/L (ref 135–145)

## 2022-09-15 MED ORDER — HALOPERIDOL LACTATE 5 MG/ML IJ SOLN
2.0000 mg | Freq: Four times a day (QID) | INTRAMUSCULAR | Status: DC | PRN
  Administered 2022-09-15: 2 mg via INTRAVENOUS
  Filled 2022-09-15: qty 1

## 2022-09-15 MED ORDER — PROMETHAZINE HCL 25 MG PO TABS: 25.0000 mg | ORAL_TABLET | Freq: Four times a day (QID) | ORAL | 0 refills | Status: DC | PRN

## 2022-09-15 MED ORDER — FAMOTIDINE IN NACL 20-0.9 MG/50ML-% IV SOLN
20.0000 mg | Freq: Once | INTRAVENOUS | Status: AC
  Administered 2022-09-15: 20 mg via INTRAVENOUS
  Filled 2022-09-15: qty 50

## 2022-09-15 MED ORDER — LACTATED RINGERS IV BOLUS
1000.0000 mL | Freq: Once | INTRAVENOUS | Status: AC
  Administered 2022-09-15: 1000 mL via INTRAVENOUS

## 2022-09-15 MED ORDER — PROMETHAZINE HCL 25 MG/ML IJ SOLN
25.0000 mg | Freq: Once | INTRAVENOUS | Status: AC
  Administered 2022-09-15: 25 mg via INTRAVENOUS
  Filled 2022-09-15: qty 1

## 2022-09-15 MED ORDER — SODIUM CHLORIDE 0.9 % IV SOLN
8.0000 mg | Freq: Once | INTRAVENOUS | Status: AC
  Administered 2022-09-15: 8 mg via INTRAVENOUS
  Filled 2022-09-15: qty 4

## 2022-09-15 NOTE — MAU Note (Signed)
.  Amy Singh is a 21 y.o. at Unknown here in MAU reporting: n/v non stop for 2 days. Has been using scope patch but not helping. Cvan't keep anything down. Unable to give a urine sample at this time.  LMP: 07/25/22 Onset of complaint: 2 days  Pain score: 7 Vitals:   09/15/22 1408  BP: (!) 121/50  Pulse: 88  Resp: 18  Temp: (!) 97.3 F (36.3 C)     FHT:n/a  Lab orders placed from triage:  u/a ( unable to give sample)

## 2022-09-15 NOTE — MAU Provider Note (Signed)
[place]     CSN: [id number]  Arrival date and time: 09/15/22 1327   Event Date/Time   First Provider Initiated Contact with Patient 09/15/22 1445      Chief Complaint  Patient presents with   Emesis   Nausea   [name] is a 21 y.o. year old G77P1011 female at [redacted]w[redacted]d weeks gestation who presents to MAU reporting "non-stop"N/V for the past 2 days. She has tried the Scope patch, but reports "it hasn't worked." She reports she can't keep anything down. She rates her abdominal pain 7/10. She receives [facility] with [place]; next appt is 09/29/2022 & 10/12/2022.  OB History     Gravida  3   Para  1   Term  1   Preterm      AB  1   Living  1      SAB  1   IAB      Ectopic      Multiple  0   Live Births  1           Past Medical History:  Diagnosis Date   Alpha thalassemia silent carrier 03/24/2021   On horizon 4   Chlamydia infection affecting pregnancy 03/16/2021   neg toc 04/2021   Gonorrhea affecting pregnancy 03/16/2021   [x ] toc late July: neg toc    History of chlamydia    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   History of molluscum contagiosum    IUFD at less than 20 weeks of gestation 11/18/2020   Neg lupus anti-coag, anti-phospholipid antibodies, anti-cardiolipin, anti-beta2 glycoprotein    Severe pre-eclampsia 09/21/2021   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   Qualifier: Diagnosis of  By: Radene Ou MD, [place]      Past Surgical History:  Procedure Laterality Date   WISDOM TOOTH EXTRACTION      Family History  Problem Relation Age of Onset   Hypertension Paternal Grandmother    Diabetes Paternal Grandmother     Social History   Tobacco Use   Smoking status: Never   Smokeless tobacco: Never  Vaping Use   Vaping Use: Never used  Substance Use Topics   Alcohol use: No   Drug use: Yes    Types: Marijuana    Comment: 09/14/22    Allergies: No Known Allergies  Medications Prior to Admission  Medication Sig Dispense Refill Last  Dose   scopolamine (TRANSDERM-SCOP) 1 MG/3DAYS Place 1 patch (1.5 mg total) onto the skin every 3 (three) days. 10 patch 12     Review of Systems  Constitutional: Negative.   HENT: Negative.    Eyes: Negative.   Respiratory: Negative.    Cardiovascular: Negative.   Gastrointestinal:  Positive for abdominal pain, nausea and vomiting.  Endocrine: Negative.   Genitourinary: Negative.   Musculoskeletal: Negative.   Skin: Negative.   Allergic/Immunologic: Negative.   Neurological: Negative.   Hematological: Negative.   Psychiatric/Behavioral: Negative.     Physical Exam   Blood pressure (!) 121/50, pulse 88, temperature (!) 97.3 F (36.3 C), resp. rate 18, height 5\' 3"  (1.6 m), weight 72.1 kg, last menstrual period 07/25/2022, unknown if currently breastfeeding.  Physical Exam Vitals and nursing note reviewed.  Constitutional:      Appearance: Normal appearance. She is normal weight.  Cardiovascular:     Rate and Rhythm: Normal rate and regular rhythm.  Pulmonary:     Effort: Pulmonary effort is normal.     Breath sounds: Normal breath sounds.  Abdominal:     General: Abdomen is flat. Bowel sounds are decreased.     Palpations: Abdomen is soft.  Genitourinary:    Comments: Not indicated Musculoskeletal:        General: Normal range of motion.  Skin:    General: Skin is warm and dry.  Neurological:     Mental Status: She is alert and oriented to person, place, and time.  Psychiatric:        Mood and Affect: Mood normal.        Behavior: Behavior normal.        Thought Content: Thought content normal.        Judgment: Judgment normal.   Reassessment @ 1645: IVFs completed and patient vomiting. RN requesting additional anti-emetics. Patient admits to smoking marijuana on a regular basis.  MAU Course  Procedures  MDM CCUA -- unable to void BMP CBC IVFs: Phenergan 25 mg in LR 1000 ml @ 999 ml/hr Zofran 8 mg IVPB -- resolved nausea/vomiting in combination with  Haldol Scopolamine Patch x 1 Pepcid 20 mg IVPB Haldol 2 mg IVP -- improved N/V PO Challenge -- patient tolerated well   Results for orders placed or performed during the hospital encounter of 09/15/22 (from the past 24 hour(s))  Basic metabolic panel     Status: Abnormal   Collection Time: 09/15/22  3:00 PM  Result Value Ref Range   Sodium 136 135 - 145 mmol/L   Potassium 3.2 (L) 3.5 - 5.1 mmol/L   Chloride 103 98 - 111 mmol/L   CO2 23 22 - 32 mmol/L   Glucose, Bld 104 (H) 70 - 99 mg/dL   BUN 7 6 - 20 mg/dL   Creatinine, Ser 8.03 0.44 - 1.00 mg/dL   Calcium 9.5 8.9 - 21.2 mg/dL   GFR, Estimated >24 >82 mL/min   Anion gap 10 5 - 15  CBC     Status: Abnormal   Collection Time: 09/15/22  4:15 PM  Result Value Ref Range   WBC 15.5 (H) 4.0 - 10.5 K/uL   RBC 4.98 3.87 - 5.11 MIL/uL   Hemoglobin 13.3 12.0 - 15.0 g/dL   HCT 50.0 37.0 - 48.8 %   MCV 79.1 (L) 80.0 - 100.0 fL   MCH 26.7 26.0 - 34.0 pg   MCHC 33.8 30.0 - 36.0 g/dL   RDW 89.1 69.4 - 50.3 %   Platelets 232 150 - 400 K/uL   nRBC 0.0 0.0 - 0.2 %    Assessment and Plan  1. Cannabinoid hyperemesis syndrome - Rx: Phenergan 25 mg PO every 6 hours prn N/V - Information provided on Cannabinoid Hyperemesis Syndrome and MJ use in pregnancy - Advised to stop using MJ  2. [redacted] weeks gestation of pregnancy   - Discharge patient - Keep scheduled appt with [facility] on 09/29/2022 - Patient verbalized an understanding of the plan of care and agrees.   Amy Singh, CNM 09/15/2022, 2:45 PM

## 2022-09-15 NOTE — Discharge Instructions (Signed)
You have to STOP smoking marijuana or else this cycle of nausea and vomiting will continue to be as bad as it has been for you the past 2 days.

## 2022-09-25 NOTE — L&D Delivery Note (Signed)
OB/GYN Faculty Practice Delivery Note  Amy Singh is a 22 y.o. G3P1011 s/p VD at [redacted]w[redacted]d. She was admitted for IOL 2/2 preE w/ SF.   ROM: 6h 44m with clear fluid GBS Status: Negative   Maximum Maternal Temperature: 98.1  Labor Progress: Initial SVE: 1.5/70/-3. She then progressed to complete.   Delivery Date/Time: 03/31/2023 @0929  Delivery: Called to room at 0928 and patient was 7cm and involuntarily pushing. The infant delivered prior to my arrival due to natural labor progression. Head delivered MOA. No nuchal cord present, but a body and leg cord was recorded. Shoulder and body delivered in usual fashion. Infant with spontaneous cry, held by RN until the cord was clamped, dried and stimulated. Cord clamped x 2 after 1-minute delay, and cut by FOB. Cord blood drawn. Placenta delivered spontaneously with gentle cord traction. Fundus firm with massage and Pitocin. Labia, perineum, vagina, and cervix inspected. There was a hemostatic right periurethral abrasion that was not repaired due to good approximation and hemostatic nature.   Baby Weight: pending  Placenta: 3 vessel, intact. Sent to L&D Complications: Precipitous second stage of labor Lacerations: Right periurethral EBL: 112 mL Analgesia: Maternally supported  Infant:  APGAR (1 MIN): Pending; see delivery summary APGAR (5 MINS):  Pending see delivery summary  Lavonda Jumbo, DO OB Fellow, Faculty Practice [facility], Center for [facility] 03/31/2023, 10:15 AM

## 2022-10-04 ENCOUNTER — Encounter: Payer: Self-pay | Admitting: Obstetrics and Gynecology

## 2022-10-12 ENCOUNTER — Encounter: Payer: Self-pay | Admitting: Obstetrics and Gynecology

## 2022-10-12 ENCOUNTER — Other Ambulatory Visit (HOSPITAL_COMMUNITY)
Admission: RE | Admit: 2022-10-12 | Discharge: 2022-10-12 | Disposition: A | Discharge status: Home / Self Care | Payer: BC Managed Care – PPO | Source: Ambulatory Visit | Attending: Obstetrics and Gynecology | Admitting: Obstetrics and Gynecology

## 2022-10-12 ENCOUNTER — Ambulatory Visit (INDEPENDENT_AMBULATORY_CARE_PROVIDER_SITE_OTHER): Payer: BC Managed Care – PPO | Admitting: Obstetrics and Gynecology

## 2022-10-12 VITALS — BP 120/74 | HR 78 | Wt 155.0 lb

## 2022-10-12 DIAGNOSIS — O09891 Supervision of other high risk pregnancies, first trimester: Secondary | ICD-10-CM

## 2022-10-12 DIAGNOSIS — Z3A11 11 weeks gestation of pregnancy: Secondary | ICD-10-CM

## 2022-10-12 DIAGNOSIS — Z8759 Personal history of other complications of pregnancy, childbirth and the puerperium: Secondary | ICD-10-CM

## 2022-10-12 DIAGNOSIS — O021 Missed abortion: Secondary | ICD-10-CM | POA: Diagnosis not present

## 2022-10-12 DIAGNOSIS — O0990 Supervision of high risk pregnancy, unspecified, unspecified trimester: Secondary | ICD-10-CM | POA: Insufficient documentation

## 2022-10-12 DIAGNOSIS — Z862 Personal history of diseases of the blood and blood-forming organs and certain disorders involving the immune mechanism: Secondary | ICD-10-CM | POA: Diagnosis not present

## 2022-10-12 DIAGNOSIS — O09899 Supervision of other high risk pregnancies, unspecified trimester: Secondary | ICD-10-CM | POA: Insufficient documentation

## 2022-10-12 DIAGNOSIS — O0991 Supervision of high risk pregnancy, unspecified, first trimester: Secondary | ICD-10-CM | POA: Diagnosis not present

## 2022-10-12 MED ORDER — MAGNESIUM OXIDE 400 240 MG PO PACK: 1.0000 | PACK | Freq: Every day | ORAL | 3 refills | Status: DC | PRN

## 2022-10-12 MED ORDER — ASPIRIN 81 MG PO TBEC: 81.0000 mg | DELAYED_RELEASE_TABLET | Freq: Every day | ORAL | 2 refills | Status: DC

## 2022-10-12 NOTE — Progress Notes (Signed)
New OB Note  10/12/2022   Clinic: [facility]  Chief Complaint: new OB  Transfer of Care Patient: no  History of Present Illness: Amy Singh is a 22 y.o. G3P1011 @ 11/2 weeks ([place] 8/6 [tentative], based on Patient's last menstrual period was 07/25/2022.).  She was considering an abortion and had an u/s on 1/5 at [facility] that put her at 10/6 weeks, [facility] 7/27.  Preg complicated by has History of hyperemesis gravidarum; History of marijuana use; IUFD at less than 20 weeks of gestation; History of severe pre-eclampsia; Short interval between pregnancies affecting pregnancy, antepartum; and Supervision of high risk pregnancy, antepartum on their problem list.   Patient denies any SAB s/s Scopolamine patches are working well  ROS: A 12-point review of systems was performed and negative, except as stated in the above HPI.  OBGYN History: As per HPI. OB History  Gravida Para Term Preterm AB Living  3 1 1   1 1   SAB IAB Ectopic Multiple Live Births  1     0 1    # Outcome Date GA Lbr Len/2nd Weight Sex Delivery Anes PTL Lv  3 Current           2 Term 09/21/21 110w1d 19:55 / 00:07 7 lb 5.1 oz (3.32 kg) F Vag-Spont None  LIV  1 SAB 11/19/20 [redacted]w[redacted]d            Complications: IUFD at less than 20 weeks of gestation    Past Medical History: Past Medical History:  Diagnosis Date   Alpha thalassemia silent carrier 03/24/2021   On horizon 4   Chlamydia infection affecting pregnancy 03/16/2021   neg toc 04/2021   Gonorrhea affecting pregnancy 03/16/2021   [x ] toc late July: neg toc    History of chlamydia    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   History of molluscum contagiosum    IUFD at less than 20 weeks of gestation 11/18/2020   Neg lupus anti-coag, anti-phospholipid antibodies, anti-cardiolipin, anti-beta2 glycoprotein    Severe pre-eclampsia 09/21/2021   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   Qualifier: Diagnosis of  By: Radene Ou MD, [place]      Past  Surgical History: Past Surgical History:  Procedure Laterality Date   WISDOM TOOTH EXTRACTION      Family History:  Family History  Problem Relation Age of Onset   Hypertension Paternal Grandmother    Diabetes Paternal Grandmother     Social History:  Social History   Socioeconomic History   Marital status: Single    Spouse name: Not on file   Number of children: Not on file   Years of education: Not on file   Highest education level: Not on file  Occupational History   Not on file  Tobacco Use   Smoking status: Never   Smokeless tobacco: Never  Vaping Use   Vaping Use: Never used  Substance and Sexual Activity   Alcohol use: No   Drug use: Yes    Types: Marijuana    Comment: 09/14/22   Sexual activity: Yes    Partners: Male    Comment: Pregnant   Other Topics Concern   Not on file  Social History Narrative   Not on file   Social Determinants of Health   Financial Resource Strain: Not on file  Food Insecurity: Not on file  Transportation Needs: Not on file  Physical Activity: Not on file  Stress: Not on file  Social Connections:  Not on file  Intimate Partner Violence: Not on file    Allergy: No Known Allergies  Current Outpatient Medications: Prenatal vitamins Scopolamine patch  Physical Exam:   BP 120/74   Pulse 78   Wt 155 lb (70.3 kg)   LMP 07/25/2022   BMI 27.46 kg/m  Body mass index is 27.46 kg/m. Contractions: Not present Vag. Bleeding: None. FHTs: 150s  General appearance: Well nourished, well developed female in no acute distress.  Neck:  Supple, normal appearance, and no thyromegaly  Cardiovascular: S1, S2 normal, no murmur, rub or gallop, regular rate and rhythm Respiratory:  Clear to auscultation bilateral. Normal respiratory effort Abdomen: positive bowel sounds and no masses, hernias; diffusely non tender to palpation, non distended Breasts: patient denies any breast s/s. Marland Kitchen Neuro/Psych:  Normal mood and affect.  Skin:  Warm  and dry.   Laboratory: none  Imaging:  none  Assessment: patient doing well  Plan: 1. [redacted] weeks gestation of pregnancy Offer afp next visit Confirm dates after anatomy u/s - [place] MFM OB DETAIL +14 WK; Future  2. Supervision of high risk pregnancy, antepartum - Cervicovaginal ancillary only( [place]) - CBC/D/Plt+RPR+Rh+ABO+RubIgG... - Culture, OB Urine - PANORAMA PRENATAL TEST FULL PANEL - TSH Rfx on Abnormal to Free T4 - Protein / creatinine ratio, urine - Comprehensive metabolic panel - Ferritin - [place] MFM OB DETAIL +14 WK; Future  3. History of hyperemesis gravidarum 25lbs weight loss with first pregnancy and did well with meds last pregnancy. Continue to follow  4. History of severe pre-eclampsia Pt amenable to low dose asa - Cervicovaginal ancillary only( [place]) - CBC/D/Plt+RPR+Rh+ABO+RubIgG... - Culture, OB Urine - TSH Rfx on Abnormal to Free T4 - Protein / creatinine ratio, urine - Comprehensive metabolic panel - Ferritin - [place] MFM OB DETAIL +14 WK; Future  5. History of IUFD With her first pregnancy at 16wks; negative limited thrombophilia work up Consider qmonth growth u/s - Cervicovaginal ancillary only( [place]) - CBC/D/Plt+RPR+Rh+ABO+RubIgG... - Culture, OB Urine - TSH Rfx on Abnormal to Free T4 - Protein / creatinine ratio, urine - Comprehensive metabolic panel - Ferritin - [place] MFM OB DETAIL +14 WK; Future  6. History of anemia - Ferritin  7. Short interval between pregnancies affecting pregnancy, antepartum  Problem list reviewed and updated.  Follow up in 4 weeks.  >50% of 35 min visit spent on counseling and coordination of care.     Durene Romans MD Attending Center for [facility])

## 2022-10-12 NOTE — Progress Notes (Signed)
  NOB [redacted]w[redacted]d  Pt requesting U/S  very anxious. Wants to confirm EDD.   HA's mostly at PM . Has not tried Rx or OTC treatment unsure of what was safe while pregnant.  Flu Vaccine : Declines  Genetic Testing: Desires wants to know Gender.

## 2022-10-13 LAB — CBC/D/PLT+RPR+RH+ABO+RUBIGG...
Antibody Screen: NEGATIVE
Basophils Absolute: 0 10*3/uL (ref 0.0–0.2)
Basos: 0 %
EOS (ABSOLUTE): 0.1 10*3/uL (ref 0.0–0.4)
Eos: 1 %
HCV Ab: NONREACTIVE
HIV Screen 4th Generation wRfx: NONREACTIVE
Hematocrit: 37.4 % (ref 34.0–46.6)
Hemoglobin: 12.5 g/dL (ref 11.1–15.9)
Hepatitis B Surface Ag: NEGATIVE
Immature Grans (Abs): 0 10*3/uL (ref 0.0–0.1)
Immature Granulocytes: 0 %
Lymphocytes Absolute: 2.5 10*3/uL (ref 0.7–3.1)
Lymphs: 24 %
MCH: 27.2 pg (ref 26.6–33.0)
MCHC: 33.4 g/dL (ref 31.5–35.7)
MCV: 81 fL (ref 79–97)
Monocytes Absolute: 0.4 10*3/uL (ref 0.1–0.9)
Monocytes: 4 %
Neutrophils Absolute: 7.4 10*3/uL — ABNORMAL HIGH (ref 1.4–7.0)
Neutrophils: 71 %
Platelets: 232 10*3/uL (ref 150–450)
RBC: 4.6 x10E6/uL (ref 3.77–5.28)
RDW: 15.8 % — ABNORMAL HIGH (ref 11.7–15.4)
RPR Ser Ql: NONREACTIVE
Rh Factor: POSITIVE
Rubella Antibodies, IGG: 4.31 index (ref >0.99)
WBC: 10.5 10*3/uL (ref 3.4–10.8)

## 2022-10-13 LAB — COMPREHENSIVE METABOLIC PANEL
ALT: 20 IU/L (ref 0–32)
AST: 11 IU/L (ref 0–40)
Albumin/Globulin Ratio: 1.8 (ref 1.2–2.2)
Albumin: 4.3 g/dL (ref 4.0–5.0)
Alkaline Phosphatase: 57 IU/L (ref 42–106)
BUN/Creatinine Ratio: 15 (ref 9–23)
BUN: 6 mg/dL (ref 6–20)
Bilirubin Total: 0.5 mg/dL (ref 0.0–1.2)
CO2: 19 mmol/L — ABNORMAL LOW (ref 20–29)
Calcium: 9.4 mg/dL (ref 8.7–10.2)
Chloride: 101 mmol/L (ref 96–106)
Creatinine, Ser: 0.41 mg/dL — ABNORMAL LOW (ref 0.57–1.00)
Globulin, Total: 2.4 g/dL (ref 1.5–4.5)
Glucose: 76 mg/dL (ref 70–99)
Potassium: 4.2 mmol/L (ref 3.5–5.2)
Sodium: 134 mmol/L (ref 134–144)
Total Protein: 6.7 g/dL (ref 6.0–8.5)
eGFR: 144 mL/min/{1.73_m2} (ref >59)

## 2022-10-13 LAB — FERRITIN: Ferritin: 28 ng/mL (ref 15–150)

## 2022-10-13 LAB — CERVICOVAGINAL ANCILLARY ONLY
Chlamydia: POSITIVE — AB
Comment: NEGATIVE
Comment: NORMAL
Neisseria Gonorrhea: POSITIVE — AB

## 2022-10-13 LAB — HCV INTERPRETATION

## 2022-10-13 LAB — PROTEIN / CREATININE RATIO, URINE
Creatinine, Urine: 134.6 mg/dL
Protein, Ur: 10.3 mg/dL
Protein/Creat Ratio: 77 mg/g creat (ref 0–200)

## 2022-10-13 LAB — TSH RFX ON ABNORMAL TO FREE T4: TSH: 0.975 u[IU]/mL (ref 0.450–4.500)

## 2022-10-14 ENCOUNTER — Encounter: Payer: Self-pay | Admitting: Obstetrics and Gynecology

## 2022-10-14 DIAGNOSIS — A749 Chlamydial infection, unspecified: Secondary | ICD-10-CM | POA: Insufficient documentation

## 2022-10-14 LAB — CULTURE, OB URINE

## 2022-10-14 LAB — URINE CULTURE, OB REFLEX: Organism ID, Bacteria: NO GROWTH

## 2022-10-14 MED ORDER — AZITHROMYCIN 500 MG PO TABS: 1000.0000 mg | ORAL_TABLET | Freq: Once | ORAL | 0 refills | Status: AC

## 2022-10-14 NOTE — Addendum Note (Signed)
Addended by: Aletha Halim on: 10/14/2022 01:20 AM   Modules accepted: Orders

## 2022-10-16 ENCOUNTER — Telehealth: Payer: Self-pay | Admitting: *Deleted

## 2022-10-16 NOTE — Telephone Encounter (Signed)
Left message for pt to call back and get her appt to get her injection.

## 2022-10-17 ENCOUNTER — Telehealth: Payer: Self-pay

## 2022-10-17 NOTE — Patient Outreach (Signed)
  Care Coordination   10/17/2022 Name: Amy Singh MRN: [id number] DOB: 03/22/2001   Care Coordination Outreach Attempts:  An unsuccessful telephone outreach was attempted today to offer the patient information about available care coordination services as a benefit of their health plan.   Follow Up Plan:  Additional outreach attempts will be made to offer the patient care coordination information and services.   Encounter Outcome:  No Answer   Care Coordination Interventions:  No, not indicated    Thea Silversmith, RN, MSN, BSN, [place] Coordinator [phone]

## 2022-10-19 LAB — PANORAMA PRENATAL TEST FULL PANEL:PANORAMA TEST PLUS 5 ADDITIONAL MICRODELETIONS: FETAL FRACTION: 7.4

## 2022-10-20 ENCOUNTER — Ambulatory Visit: Payer: BC Managed Care – PPO

## 2022-10-24 ENCOUNTER — Ambulatory Visit (INDEPENDENT_AMBULATORY_CARE_PROVIDER_SITE_OTHER): Payer: BC Managed Care – PPO

## 2022-10-24 VITALS — BP 109/65 | HR 87 | Ht 63.5 in | Wt 159.6 lb

## 2022-10-24 DIAGNOSIS — O98211 Gonorrhea complicating pregnancy, first trimester: Secondary | ICD-10-CM

## 2022-10-24 MED ORDER — CEFTRIAXONE SODIUM 500 MG IJ SOLR
500.0000 mg | Freq: Once | INTRAMUSCULAR | Status: AC
Start: 2022-10-24 — End: 2022-10-24
  Administered 2022-10-24: 500 mg via INTRAMUSCULAR

## 2022-10-24 NOTE — Progress Notes (Signed)
CC: Rocephin   Pt here for rocephin, Pt tolerated well, Pt reminded no intercourse for 7 days, partner to be treated, Pt verbalized understanding.    Donnie Aho, CMA

## 2022-10-25 NOTE — Progress Notes (Signed)
Patient was assessed and managed by nursing staff during this encounter. I have reviewed the chart and agree with the documentation and plan. I have also made any necessary editorial changes.  Aletha Halim, MD 10/25/2022 8:20 AM

## 2022-11-01 ENCOUNTER — Encounter: Payer: Self-pay | Admitting: *Deleted

## 2022-11-13 ENCOUNTER — Ambulatory Visit (INDEPENDENT_AMBULATORY_CARE_PROVIDER_SITE_OTHER): Payer: BC Managed Care – PPO | Admitting: Obstetrics and Gynecology

## 2022-11-13 VITALS — BP 104/67 | HR 73 | Wt 162.0 lb

## 2022-11-13 DIAGNOSIS — O98812 Other maternal infectious and parasitic diseases complicating pregnancy, second trimester: Secondary | ICD-10-CM

## 2022-11-13 DIAGNOSIS — O0992 Supervision of high risk pregnancy, unspecified, second trimester: Secondary | ICD-10-CM

## 2022-11-13 DIAGNOSIS — R519 Headache, unspecified: Secondary | ICD-10-CM

## 2022-11-13 DIAGNOSIS — O021 Missed abortion: Secondary | ICD-10-CM

## 2022-11-13 DIAGNOSIS — O09892 Supervision of other high risk pregnancies, second trimester: Secondary | ICD-10-CM

## 2022-11-13 DIAGNOSIS — A749 Chlamydial infection, unspecified: Secondary | ICD-10-CM

## 2022-11-13 DIAGNOSIS — R790 Abnormal level of blood mineral: Secondary | ICD-10-CM

## 2022-11-13 DIAGNOSIS — Z3A15 15 weeks gestation of pregnancy: Secondary | ICD-10-CM

## 2022-11-13 DIAGNOSIS — O98212 Gonorrhea complicating pregnancy, second trimester: Secondary | ICD-10-CM

## 2022-11-13 DIAGNOSIS — O0990 Supervision of high risk pregnancy, unspecified, unspecified trimester: Secondary | ICD-10-CM

## 2022-11-13 DIAGNOSIS — O26892 Other specified pregnancy related conditions, second trimester: Secondary | ICD-10-CM

## 2022-11-13 DIAGNOSIS — Z8759 Personal history of other complications of pregnancy, childbirth and the puerperium: Secondary | ICD-10-CM

## 2022-11-13 DIAGNOSIS — O98211 Gonorrhea complicating pregnancy, first trimester: Secondary | ICD-10-CM

## 2022-11-13 DIAGNOSIS — O09899 Supervision of other high risk pregnancies, unspecified trimester: Secondary | ICD-10-CM

## 2022-11-13 MED ORDER — FERROUS GLUCONATE 324 (38 FE) MG PO TABS: 324.0000 mg | ORAL_TABLET | ORAL | 0 refills | Status: DC

## 2022-11-13 MED ORDER — MAGNESIUM OXIDE 400 240 MG PO PACK: 1.0000 | PACK | Freq: Every day | ORAL | 3 refills | Status: DC | PRN

## 2022-11-13 NOTE — Progress Notes (Signed)
ROB   CC: Migraines.   Pt states she has not stared asa or magnesium was told by pharmcy Rx was OTC.

## 2022-11-13 NOTE — Progress Notes (Signed)
   PRENATAL VISIT NOTE  Subjective:  Amy Singh is a 22 y.o. G3P1011 at 24w6dbeing seen today for ongoing prenatal care.  She is currently monitored for the following issues for this high-risk pregnancy and has History of hyperemesis gravidarum; History of marijuana use; IUFD at less than 20 weeks of gestation; Gonorrhea affecting pregnancy in first trimester; History of severe pre-eclampsia; Short interval between pregnancies affecting pregnancy, antepartum; Supervision of high risk pregnancy, antepartum; and Chlamydia infection affecting pregnancy in first trimester on their problem list.  Patient reports  headaches .  Contractions: Not present. Vag. Bleeding: None.  Movement: Present. Denies leaking of fluid.   The following portions of the patient's history were reviewed and updated as appropriate: allergies, current medications, past family history, past medical history, past social history, past surgical history and problem list.   Objective:   Vitals:   11/13/22 1448  BP: 104/67  Pulse: 73  Weight: 162 lb (73.5 kg)    Fetal Status: Fetal Heart Rate (bpm): 152   Movement: Present     General:  Alert, oriented and cooperative. Patient is in no acute distress.  Skin: Skin is warm and dry. No rash noted.   Cardiovascular: Normal heart rate noted  Respiratory: Normal respiratory effort, no problems with respiration noted  Abdomen: Soft, gravid, appropriate for gestational age.  Pain/Pressure: Absent     Pelvic: Cervical exam deferred        Extremities: Normal range of motion.  Edema: None  Mental Status: Normal mood and affect. Normal behavior. Normal judgment and thought content.   Assessment and Plan:  Pregnancy: G3P1011 at [redacted]w[redacted]d. Supervision of high risk pregnancy, antepartum Routine care. Already scheduled for anatomy u/s - AFP, Serum, Open Spina Bifida - Magnesium Oxide (MAGNESIUM OXIDE 400) 240 MG PACK; Take 1 tablet by mouth daily as needed (headache).  Dispense:  30 each; Refill: 3  2. Low ferritin Pt amenable to starting qod iron  3. Pregnancy headache in second trimester Only getting 5-6h of sleep per night; she goes to sleep around 2300. She will wake up for childcare. She is currently at home so I encouraged her to try and get a nap in during the day and also to stay well hydrated. PRN Mg d/w her  4. IUFD at less than 20 weeks of gestation - AFP, Serum, Open Spina Bifida  5. History of severe pre-eclampsia Continue low dose asa   6. History of hyperemesis gravidarum 7lbs since last visit and patient feeling well. She's still around 8lbs down from her pre-pregnancy weight  7. Chlamydia infection affecting pregnancy in first trimester Toc nv  8. Gonorrhea affecting pregnancy in first trimester Toc nv  9. Short interval between pregnancies affecting pregnancy, antepartum  Preterm labor symptoms and general obstetric precautions including but not limited to vaginal bleeding, contractions, leaking of fluid and fetal movement were reviewed in detail with the patient. Please refer to After Visit Summary for other counseling recommendations.   Return in about 1 month (around 12/12/2022) for md or app, low risk ob, in person.  Future Appointments  Date Time Provider [place]3/12/2022  1:30 [facility]  1:45 PM [facility] US5 [facility]/18/2024  2:30 PM Anyanwu, UgSallyanne HaversMD [facility]  01/10/2023  2:30 PM Anyanwu, UgSallyanne HaversMD [facility]    ChAletha HalimMD

## 2022-11-15 LAB — AFP, SERUM, OPEN SPINA BIFIDA
AFP MoM: 1.26
AFP Value: 35.5 ng/mL
Gest. Age on Collection Date: 15 weeks
Maternal Age At EDD: 20.9 yr
OSBR Risk 1 IN: 5411
Test Results:: NEGATIVE
Weight: 162 [lb_av]

## 2022-11-21 ENCOUNTER — Encounter: Payer: Self-pay | Admitting: Obstetrics and Gynecology

## 2022-11-24 ENCOUNTER — Other Ambulatory Visit (HOSPITAL_COMMUNITY)
Admission: RE | Admit: 2022-11-24 | Discharge: 2022-11-24 | Disposition: A | Discharge status: Home / Self Care | Payer: BC Managed Care – PPO | Source: Ambulatory Visit | Attending: Obstetrics & Gynecology | Admitting: Obstetrics & Gynecology

## 2022-11-24 ENCOUNTER — Ambulatory Visit (INDEPENDENT_AMBULATORY_CARE_PROVIDER_SITE_OTHER): Payer: BC Managed Care – PPO

## 2022-11-24 DIAGNOSIS — O98211 Gonorrhea complicating pregnancy, first trimester: Secondary | ICD-10-CM | POA: Diagnosis not present

## 2022-11-24 DIAGNOSIS — O98212 Gonorrhea complicating pregnancy, second trimester: Secondary | ICD-10-CM | POA: Diagnosis not present

## 2022-11-24 DIAGNOSIS — A749 Chlamydial infection, unspecified: Secondary | ICD-10-CM | POA: Insufficient documentation

## 2022-11-24 DIAGNOSIS — O98811 Other maternal infectious and parasitic diseases complicating pregnancy, first trimester: Secondary | ICD-10-CM | POA: Insufficient documentation

## 2022-11-24 DIAGNOSIS — Z3A17 17 weeks gestation of pregnancy: Secondary | ICD-10-CM | POA: Diagnosis not present

## 2022-11-24 NOTE — Progress Notes (Signed)
SUBJECTIVE:  22 y.o. female complains of None  Denies abnormal vaginal bleeding or significant pelvic pain or fever. Previous Swab was positive for Gonorrhea and Chlamydia.  Patient's last menstrual period was 07/25/2022.  OBJECTIVE:  She appears alert, well appearing, in no apparent distress   ASSESSMENT:  None    PLAN:  GC, chlamydia, trichomonas, BVAG, CVAG probe and urine culture sent to lab. Treatment: To be determined once lab results are received ROV prn if symptoms persist or worsen.   Donnie Aho, CMA

## 2022-11-27 ENCOUNTER — Ambulatory Visit: Payer: BC Managed Care – PPO | Admitting: *Deleted

## 2022-11-27 ENCOUNTER — Ambulatory Visit: Payer: BC Managed Care – PPO | Attending: Obstetrics and Gynecology

## 2022-11-27 ENCOUNTER — Encounter: Payer: Self-pay | Admitting: *Deleted

## 2022-11-27 ENCOUNTER — Other Ambulatory Visit: Payer: Self-pay | Admitting: Family Medicine

## 2022-11-27 VITALS — BP 108/54 | HR 82

## 2022-11-27 DIAGNOSIS — Z3A11 11 weeks gestation of pregnancy: Secondary | ICD-10-CM

## 2022-11-27 DIAGNOSIS — Z3A19 19 weeks gestation of pregnancy: Secondary | ICD-10-CM | POA: Diagnosis not present

## 2022-11-27 DIAGNOSIS — Z8759 Personal history of other complications of pregnancy, childbirth and the puerperium: Secondary | ICD-10-CM | POA: Diagnosis not present

## 2022-11-27 DIAGNOSIS — O0992 Supervision of high risk pregnancy, unspecified, second trimester: Secondary | ICD-10-CM | POA: Diagnosis not present

## 2022-11-27 DIAGNOSIS — O99212 Obesity complicating pregnancy, second trimester: Secondary | ICD-10-CM | POA: Diagnosis not present

## 2022-11-27 DIAGNOSIS — Z148 Genetic carrier of other disease: Secondary | ICD-10-CM | POA: Insufficient documentation

## 2022-11-27 DIAGNOSIS — O0990 Supervision of high risk pregnancy, unspecified, unspecified trimester: Secondary | ICD-10-CM

## 2022-11-27 DIAGNOSIS — O09292 Supervision of pregnancy with other poor reproductive or obstetric history, second trimester: Secondary | ICD-10-CM | POA: Insufficient documentation

## 2022-11-27 DIAGNOSIS — A568 Sexually transmitted chlamydial infection of other sites: Secondary | ICD-10-CM

## 2022-11-27 LAB — CERVICOVAGINAL ANCILLARY ONLY
Bacterial Vaginitis (gardnerella): POSITIVE — AB
Candida Glabrata: NEGATIVE
Candida Vaginitis: NEGATIVE
Chlamydia: POSITIVE — AB
Comment: NEGATIVE
Comment: NEGATIVE
Comment: NEGATIVE
Comment: NEGATIVE
Comment: NEGATIVE
Comment: NORMAL
Neisseria Gonorrhea: NEGATIVE
Trichomonas: NEGATIVE

## 2022-11-27 MED ORDER — AZITHROMYCIN 500 MG PO TABS: 1000.0000 mg | ORAL_TABLET | Freq: Once | ORAL | 0 refills | Status: AC

## 2022-11-28 ENCOUNTER — Telehealth: Payer: Self-pay | Admitting: *Deleted

## 2022-11-28 ENCOUNTER — Other Ambulatory Visit: Payer: Self-pay | Admitting: *Deleted

## 2022-11-28 DIAGNOSIS — Z8759 Personal history of other complications of pregnancy, childbirth and the puerperium: Secondary | ICD-10-CM

## 2022-11-28 MED ORDER — METRONIDAZOLE 500 MG PO TABS: 500.0000 mg | ORAL_TABLET | Freq: Two times a day (BID) | ORAL | 0 refills | Status: AC

## 2022-11-28 NOTE — Telephone Encounter (Signed)
Called pt to make sure she saw her results. Med sent in for Chlamydia and will send in meds for BV. Advised again that pt's partner get tested and treated.

## 2022-11-28 NOTE — Telephone Encounter (Signed)
-----   Message from Caren Macadam, MD sent at 11/27/2022  4:34 PM EST ----- Chlamydia positive, treat with azithromycin. Sent to pharmacy on file

## 2022-12-11 ENCOUNTER — Telehealth: Payer: BC Managed Care – PPO | Admitting: Obstetrics & Gynecology

## 2022-12-11 ENCOUNTER — Telehealth: Payer: Self-pay | Admitting: *Deleted

## 2022-12-11 NOTE — Telephone Encounter (Signed)
Left message to see if pt was able to get back on her mychart visit

## 2022-12-12 NOTE — Progress Notes (Signed)
Erroneous encounter

## 2022-12-19 ENCOUNTER — Encounter: Payer: Self-pay | Admitting: *Deleted

## 2022-12-19 ENCOUNTER — Encounter: Payer: Self-pay | Admitting: Obstetrics & Gynecology

## 2022-12-19 ENCOUNTER — Ambulatory Visit (INDEPENDENT_AMBULATORY_CARE_PROVIDER_SITE_OTHER): Payer: BC Managed Care – PPO | Admitting: Obstetrics & Gynecology

## 2022-12-19 VITALS — BP 111/70 | HR 83 | Wt 179.0 lb

## 2022-12-19 DIAGNOSIS — A5602 Chlamydial vulvovaginitis: Secondary | ICD-10-CM

## 2022-12-19 DIAGNOSIS — O0990 Supervision of high risk pregnancy, unspecified, unspecified trimester: Secondary | ICD-10-CM

## 2022-12-19 DIAGNOSIS — A749 Chlamydial infection, unspecified: Secondary | ICD-10-CM

## 2022-12-19 DIAGNOSIS — N760 Acute vaginitis: Secondary | ICD-10-CM

## 2022-12-19 DIAGNOSIS — O23592 Infection of other part of genital tract in pregnancy, second trimester: Secondary | ICD-10-CM

## 2022-12-19 DIAGNOSIS — O98312 Other infections with a predominantly sexual mode of transmission complicating pregnancy, second trimester: Secondary | ICD-10-CM | POA: Diagnosis not present

## 2022-12-19 DIAGNOSIS — Z3A22 22 weeks gestation of pregnancy: Secondary | ICD-10-CM

## 2022-12-19 MED ORDER — AZITHROMYCIN 500 MG PO TABS: 1000.0000 mg | ORAL_TABLET | Freq: Once | ORAL | 1 refills | Status: AC

## 2022-12-19 MED ORDER — AZITHROMYCIN 500 MG PO TABS
1000.0000 mg | ORAL_TABLET | Freq: Once | ORAL | Status: AC
  Administered 2022-12-19: 1000 mg via ORAL

## 2022-12-19 MED ORDER — ONDANSETRON 4 MG PO TBDP: 4.0000 mg | ORAL_TABLET | Freq: Four times a day (QID) | ORAL | 0 refills | Status: DC | PRN

## 2022-12-19 MED ORDER — METRONIDAZOLE 500 MG PO TABS: 500.0000 mg | ORAL_TABLET | Freq: Two times a day (BID) | ORAL | 0 refills | Status: AC

## 2022-12-19 NOTE — Progress Notes (Signed)
PRENATAL VISIT NOTE  Subjective:  Amy Singh is a 22 y.o. G3P1011 at [redacted]w[redacted]d being seen today for ongoing prenatal care.  She is currently monitored for the following issues for this high-risk pregnancy and has History of hyperemesis gravidarum; History of marijuana use; IUFD at less than 20 weeks of gestation; Gonorrhea affecting pregnancy in first trimester; History of severe pre-eclampsia; Short interval between pregnancies affecting pregnancy, antepartum; Supervision of high risk pregnancy, antepartum; and Chlamydia infection affecting pregnancy in first trimester on their problem list.  Patient reports  not getting the medications prescribed for her chlamydia and BV, Has been prescribed on 3/4 and 3/23 .  Contractions: Not present. Vag. Bleeding: None.  Movement: Present. Denies leaking of fluid.   The following portions of the patient's history were reviewed and updated as appropriate: allergies, current medications, past family history, past medical history, past social history, past surgical history and problem list.   Objective:   Vitals:   12/19/22 1138  BP: 111/70  Pulse: 83  Weight: 179 lb (81.2 kg)    Fetal Status: Fetal Heart Rate (bpm): 160   Movement: Present     General:  Alert, oriented and cooperative. Patient is in no acute distress.  Skin: Skin is warm and dry. No rash noted.   Cardiovascular: Normal heart rate noted  Respiratory: Normal respiratory effort, no problems with respiration noted  Abdomen: Soft, gravid, appropriate for gestational age.  Pain/Pressure: Absent     Pelvic: Cervical exam deferred        Extremities: Normal range of motion.  Edema: Trace  Mental Status: Normal mood and affect. Normal behavior. Normal judgment and thought content.   Imaging: [place] MFM OB DETAIL +14 WK  Result Date: 11/27/2022 ----------------------------------------------------------------------  OBSTETRICS REPORT                       (Signed Final 11/27/2022 03:19 pm)  ---------------------------------------------------------------------- Patient Info  ID #:       [id number]                          D.O.B.:  Jan 06, 2001 (21 yrs)  Name:       Amy Singh                  Visit Date: 11/27/2022 01:52 pm ---------------------------------------------------------------------- Performed By  Attending:        Johnell Comings MD         Ref. Address:     [address]                                                             [place], [place]  Performed By:     Nathen May       [facility]                                                             [place] [facility]                                                             [facility]  Referred By:      Aletha Halim MD ---------------------------------------------------------------------- Orders  #  Description                           Code        Ordered By  1  [place] MFM OB DETAIL +14 [place]               768[address].01    CHARLIE PICKENS ----------------------------------------------------------------------  #  Order #                     Accession #                Episode #  1  [id number]                   [id number]                 [id number] ---------------------------------------------------------------------- Indications  Obesity complicating pregnancy, second         O99.212  trimester (pre-G BMI 30)  Poor obstetric history: Previous severe        O09.299  preeclampsia  Genetic carrier (Silent carrier Alpha Thal)    Z14.8  Encounter for antenatal screening for          Z36.3  malformations  [redacted] weeks gestation of pregnancy                Z3A.19  LR NIPS - Female, Negative AFP ---------------------------------------------------------------------- Vital Signs  BP:          108/54 ---------------------------------------------------------------------- Fetal Evaluation  Num Of  Fetuses:         1  Fetal Heart Rate(bpm):  146  Cardiac Activity:       Observed  Presentation:           Cephalic  Placenta:               Anterior  P. Cord Insertion:      Visualized, central  Amniotic Fluid  AFI FV:      Within normal limits                              Largest Pocket(cm)  4.26 ---------------------------------------------------------------------- Biometry  BPD:      44.2  mm     G. Age:  19w 3d         6  %    CI:        73.58   %    70 - 86                                                          FL/HC:      17.6   %    16.1 - 18.3  HC:      163.7  mm     G. Age:  19w 1d         34  %    HC/AC:      1.11        1.09 - 1.39  AC:      147.7  mm     G. Age:  20w 0d         71  %    FL/BPD:     65.2   %  FL:       28.8  mm     G. Age:  18w 6d         27  %    FL/AC:      19.5   %    20 - 24  HUM:      28.2  mm     G. Age:  19w 1d         46  %  CER:      19.8  mm     G. Age:  19w 1d         44  %  NFT:       4.5  mm  LV:        6.5  mm  CM:        4.9  mm  Est. FW:     293  gm    0 lb 10 oz      55  % ---------------------------------------------------------------------- OB History  Gravidity:    3         Term:   1        Prem:   0        SAB:   1  TOP:          0       Ectopic:  0        Living: 1 ---------------------------------------------------------------------- Gestational Age  LMP:           17w 6d        Date:  07/25/22                 EDD:   05/01/23  U/S Today:     19w 3d                                        EDD:   04/20/23  Best:          19w 2d     Det. ByLoman Chroman         EDD:   04/21/23                                      (  09/29/22) ---------------------------------------------------------------------- Anatomy  Cranium:               Appears normal         LVOT:                   Appears normal  Cavum:                 Appears normal         Aortic Arch:            Appears normal  Ventricles:            Appears normal         Ductal Arch:             Appears normal  Choroid Plexus:        Appears normal         Diaphragm:              Appears normal  Cerebellum:            Appears normal         Stomach:                Appears normal, left                                                                        sided  Posterior Fossa:       Appears normal         Abdomen:                Appears normal  Nuchal Fold:           Appears normal         Abdominal Wall:         Appears nml (cord                                                                        insert, abd wall)  Face:                  Orbits nl; profile not Cord Vessels:           Appears normal (3                         well visualized                                vessel cord)  Lips:                  Appears normal         Kidneys:                Appear normal  Palate:                Appears normal  Bladder:                Appears normal  Thoracic:              Appears normal         Spine:                  Appears normal  Heart:                 Appears normal         Upper Extremities:      Appears normal                         (4CH, axis, and                         situs)  RVOT:                  Appears normal         Lower Extremities:      Appears normal  Other:  Fetus appears to be a female. Heels/feet and open hands/5th digits,          lenses, maxilla, mandible and falx, VC, 3VV and 3VTV visualized. ---------------------------------------------------------------------- Cervix Uterus Adnexa  Cervix  Length:            3.3  cm.  Normal appearance by transabdominal scan  Uterus  No abnormality visualized.  Right Ovary  No adnexal mass visualized.  Left Ovary  Size(cm)     3.59   x   1.63   x  1.88      Vol(ml): 5.76  Within normal limits.  Cul De Sac  No free fluid seen.  Adnexa  No adnexal mass visualized ---------------------------------------------------------------------- Comments  This patient was seen for a detailed fetal anatomy scan.  She denies any significant past  medical history and denies  any problems in her current pregnancy.  She had a cell free DNA test earlier in her pregnancy which  indicated a low risk for trisomy 78, 59, and 13. A female fetus is  predicted.  She was informed that the fetal growth and amniotic fluid  level were appropriate for her gestational age.  There were no obvious fetal anomalies noted on today's  ultrasound exam.  However, today's exam was limited due to  the fetal position.  The patient was informed that anomalies may be missed due  to technical limitations. If the fetus is in a suboptimal position  or maternal habitus is increased, visualization of the fetus in  the maternal uterus may be impaired.  A follow-up exam was scheduled in 4 weeks to complete the  views of the fetal anatomy. ----------------------------------------------------------------------                  Johnell Comings, MD Electronically Signed Final Report   11/27/2022 03:19 pm ----------------------------------------------------------------------   Assessment and Plan:  Pregnancy: [id number] at [redacted]w[redacted]d 1. Chlamydia infection affecting pregnancy in first trimester Stressed importance of being treated for chlamydia, and discussed sequelae of untreated infection for her and her fetus.  Azithromycin 1000 mg po given to her in office today. Written Rx also given to her partner for expedited therapy. - azithromycin (ZITHROMAX) tablet 1,000 mg  2. Bacterial vaginitis during pregnancy in second trimester Flagyl re-sent to another pharmacy. - metroNIDAZOLE (FLAGYL) 500 MG tablet; Take 1 tablet (500 mg total) by mouth 2 (  two) times daily for 7 days.  Dispense: 14 tablet; Refill: 0  3. [redacted] weeks gestation of pregnancy 4. Supervision of high risk pregnancy, antepartum Incomplete anatomy scan, rescan ordered. Preterm labor symptoms and general obstetric precautions including but not limited to vaginal bleeding, contractions, leaking of fluid and fetal movement were reviewed in  detail with the patient. Please refer to After Visit Summary for other counseling recommendations.   Return in about 4 weeks (around 01/16/2023) for OFFICE OB VISIT (MD or APP).  Future Appointments  Date Time Provider [place]  12/29/2022 12:30 PM [facility] NURSE [facility]  12/29/2022 12:45 PM [facility] US5 [facility]  01/10/2023  2:30 PM Delesia Martinek, Sallyanne Havers, MD [facility]  02/02/2023  8:15 AM [facility] LAB [facility]  02/02/2023  8:55 AM Caren Macadam, MD [facility]  02/15/2023  2:30 PM Sohrab Keelan, Sallyanne Havers, MD [facility]    Verita Schneiders, MD

## 2022-12-19 NOTE — Progress Notes (Signed)
Patient called and said she threw up her clinic administered Azithromycin.  This was re-prescribed to the new pharmacy ([facility] [address]) and also sent in Zofran prescription.  She was told to take prior to taking the Azithromycin.   Verita Schneiders, MD

## 2022-12-28 ENCOUNTER — Ambulatory Visit: Payer: BC Managed Care – PPO | Attending: Obstetrics

## 2022-12-28 ENCOUNTER — Ambulatory Visit: Payer: BC Managed Care – PPO | Admitting: *Deleted

## 2022-12-28 VITALS — BP 116/52 | HR 79

## 2022-12-28 DIAGNOSIS — O99322 Drug use complicating pregnancy, second trimester: Secondary | ICD-10-CM | POA: Diagnosis not present

## 2022-12-28 DIAGNOSIS — F1290 Cannabis use, unspecified, uncomplicated: Secondary | ICD-10-CM | POA: Diagnosis not present

## 2022-12-28 DIAGNOSIS — O99212 Obesity complicating pregnancy, second trimester: Secondary | ICD-10-CM

## 2022-12-28 DIAGNOSIS — O09292 Supervision of pregnancy with other poor reproductive or obstetric history, second trimester: Secondary | ICD-10-CM

## 2022-12-28 DIAGNOSIS — Z8759 Personal history of other complications of pregnancy, childbirth and the puerperium: Secondary | ICD-10-CM | POA: Diagnosis not present

## 2022-12-28 DIAGNOSIS — D563 Thalassemia minor: Secondary | ICD-10-CM

## 2022-12-28 DIAGNOSIS — E669 Obesity, unspecified: Secondary | ICD-10-CM

## 2022-12-28 DIAGNOSIS — O0990 Supervision of high risk pregnancy, unspecified, unspecified trimester: Secondary | ICD-10-CM

## 2022-12-28 DIAGNOSIS — Z3A23 23 weeks gestation of pregnancy: Secondary | ICD-10-CM

## 2022-12-29 ENCOUNTER — Ambulatory Visit: Payer: BC Managed Care – PPO

## 2022-12-29 ENCOUNTER — Other Ambulatory Visit: Payer: Self-pay | Admitting: *Deleted

## 2022-12-29 DIAGNOSIS — Z8759 Personal history of other complications of pregnancy, childbirth and the puerperium: Secondary | ICD-10-CM

## 2022-12-29 DIAGNOSIS — O09299 Supervision of pregnancy with other poor reproductive or obstetric history, unspecified trimester: Secondary | ICD-10-CM

## 2022-12-29 DIAGNOSIS — O99320 Drug use complicating pregnancy, unspecified trimester: Secondary | ICD-10-CM

## 2023-01-10 ENCOUNTER — Ambulatory Visit (INDEPENDENT_AMBULATORY_CARE_PROVIDER_SITE_OTHER): Payer: BC Managed Care – PPO | Admitting: Obstetrics & Gynecology

## 2023-01-10 ENCOUNTER — Encounter: Payer: Self-pay | Admitting: Obstetrics & Gynecology

## 2023-01-10 VITALS — BP 111/69 | HR 84 | Wt 185.0 lb

## 2023-01-10 DIAGNOSIS — N760 Acute vaginitis: Secondary | ICD-10-CM

## 2023-01-10 DIAGNOSIS — A749 Chlamydial infection, unspecified: Secondary | ICD-10-CM

## 2023-01-10 DIAGNOSIS — Z3A25 25 weeks gestation of pregnancy: Secondary | ICD-10-CM

## 2023-01-10 DIAGNOSIS — O23592 Infection of other part of genital tract in pregnancy, second trimester: Secondary | ICD-10-CM

## 2023-01-10 DIAGNOSIS — O0990 Supervision of high risk pregnancy, unspecified, unspecified trimester: Secondary | ICD-10-CM

## 2023-01-10 DIAGNOSIS — O98812 Other maternal infectious and parasitic diseases complicating pregnancy, second trimester: Secondary | ICD-10-CM

## 2023-01-10 MED ORDER — METRONIDAZOLE 0.75 % VA GEL: 1.0000 | Freq: Every day | VAGINAL | 1 refills | Status: DC

## 2023-01-10 NOTE — Progress Notes (Signed)
   PRENATAL VISIT NOTE  Subjective:  Amy Singh is a 22 y.o. G3P1011 at [redacted]w[redacted]d being seen today for ongoing prenatal care.  She is currently monitored for the following issues for this high-risk pregnancy and has History of hyperemesis gravidarum; History of marijuana use; IUFD at less than 20 weeks of gestation; Gonorrhea affecting pregnancy in first trimester; History of severe pre-eclampsia; Short interval between pregnancies affecting pregnancy, antepartum; Supervision of high risk pregnancy, antepartum; and Chlamydia infection affecting pregnancy in first trimester on their problem list.  Patient reports no complaints. Did not take prescribed antibiotics for chlamydia and BV as prescribed last visit.  Contractions: Not present. Vag. Bleeding: None.  Movement: Present. Denies leaking of fluid.   The following portions of the patient's history were reviewed and updated as appropriate: allergies, current medications, past family history, past medical history, past social history, past surgical history and problem list.   Objective:   Vitals:   01/10/23 1454  BP: 111/69  Pulse: 84  Weight: 185 lb (83.9 kg)    Fetal Status: Fetal Heart Rate (bpm): 160 Fundal Height: 26 cm Movement: Present     General:  Alert, oriented and cooperative. Patient is in no acute distress.  Skin: Skin is warm and dry. No rash noted.   Cardiovascular: Normal heart rate noted  Respiratory: Normal respiratory effort, no problems with respiration noted  Abdomen: Soft, gravid, appropriate for gestational age.  Pain/Pressure: Absent     Pelvic: Cervical exam deferred        Extremities: Normal range of motion.  Edema: None  Mental Status: Normal mood and affect. Normal behavior. Normal judgment and thought content.   Assessment and Plan:  Pregnancy: G3P1011 at [redacted]w[redacted]d 1. Chlamydia infection affecting pregnancy in second trimester Again, stressed importance of being treated for chlamydia, and discussed sequelae  of untreated infection for her and her fetus. She will take medication as instructed.   2. Bacterial vaginitis during pregnancy in second trimester She is worried about throwing up, so prescribed Metrogel instead.  - metroNIDAZOLE (METROGEL) 0.75 % vaginal gel; Place 1 Applicatorful vaginally at bedtime. Apply one applicatorful to vagina at bedtime for 5 days  Dispense: 70 g; Refill: 1  3. [redacted] weeks gestation of pregnancy 4. Supervision of high risk pregnancy, antepartum Third trimester labs and Tdap next visit, instructions given for GTT.  Preterm labor symptoms and general obstetric precautions including but not limited to vaginal bleeding, contractions, leaking of fluid and fetal movement were reviewed in detail with the patient. Please refer to After Visit Summary for other counseling recommendations.   Return in about 23 days (around 02/02/2023) for 2 hr GTT, 3rd trimester labs, TDap, OFFICE OB VISIT (MD or APP).  Future Appointments  Date Time Provider Department Center  02/01/2023  3:30 PM [facility] NURSE [facility]  02/01/2023  3:45 PM [facility] US5 [facility]  02/02/2023  8:15 AM [facility] LAB [facility]  02/02/2023  8:55 AM Federico Flake, MD [facility]  02/15/2023  2:30 PM Hamish Banks, Jethro Bastos, MD [facility]  02/28/2023  3:30 PM Reva Bores, MD [facility]  03/14/2023  3:30 PM Reva Bores, MD [facility]    Jaynie Collins, MD

## 2023-01-10 NOTE — Patient Instructions (Signed)
Return to office for any scheduled appointments. Call the office or go to the MAU at [facility] if: You begin to have strong, frequent contractions Your water breaks.  Sometimes it is a big gush of fluid, sometimes it is just a trickle that keeps getting your underwear wet or running down your legs You have vaginal bleeding.  It is normal to have a small amount of spotting if your cervix was checked.  You do not feel your baby moving like normal.  If you do not, get something to eat and drink and lay down and focus on feeling your baby move.   If your baby is still not moving like normal, you should call the office or go to MAU. Any other obstetric concerns.  Oral Glucose Tolerance Test During Pregnancy Why am I having this test? The oral glucose tolerance test (GTT) is done to check how your body processes blood sugar (glucose). This is one of several tests used to diagnose diabetes that develops during pregnancy (gestational diabetes mellitus). Gestational diabetes is a short-term form of diabetes that some women develop while they are pregnant. It usually occurs during the second or third trimester of pregnancy and goes away after delivery. Testing, or screening, for gestational diabetes usually occurs around 28 of pregnancy. This test may also be needed earlier if: You have a history of gestational diabetes. There is a history of giving birth to very large babies or of losing pregnancies (having stillbirths). You have signs and symptoms of diabetes, such as: Changes in your eyesight. Tingling or numbness in your hands or feet. Changes in hunger, thirst, and urination, and these are not explained by your pregnancy. What is being tested? This test measures the amount of glucose in your blood at different times during a period of 2 hours. This shows how well your body can process glucose.  You will have three separate blood draws. What kind of sample is  taken?  Blood samples are required for this test. They are usually collected by inserting a needle into a blood vessel. How do I prepare for this test? For 3 days before your test, eat normally. Have plenty of carbohydrate-rich foods. You will be asked not to eat or drink anything other than water (to fast) starting 8-10 hours before the test. Tell a health care provider about: All medicines you are taking, including vitamins, herbs, eye drops, creams, and over-the-counter medicines. Any blood disorders you have. Any surgeries you have had. Any medical conditions you have. What happens during the test? First, your blood glucose will be measured. This is referred to as your fasting blood glucose because you fasted before the test. Then, you will drink a glucose solution that contains a certain amount of glucose. Your blood glucose will be measured again 1 and 2 hours after you drink the solution. This test takes about 2 hours to complete. You will need to stay at the testing location during this time. During the testing period: Do not eat or drink anything other than the glucose solution. Do not exercise. Do not use any products that contain nicotine or tobacco, such as cigarettes, e-cigarettes, and chewing tobacco. These can affect your test results. If you need help quitting, ask your health care provider. The testing procedure may vary among health care providers and hospitals. How are the results reported? Your results will be reported as milligrams of glucose per deciliter of blood (mg/dL) or millimoles per liter (mmol/L). There   is more than one source for screening and diagnosis reference values used to diagnose gestational diabetes. Your health care provider will compare your results to normal values that were established after testing a large group of people (reference values). Reference values may vary among labs and hospitals. For this test, reference values are: Fasting: 92 mg/dL 1  hour: 180 mg/dL  2 hour: 153 mg/dL   What do the results mean? Results below the reference values are considered normal. If one or more of your blood glucose levels are at or above the reference values, you will be diagnosed with gestational diabetes.  Talk with your health care provider about what your results mean. Questions to ask your health care provider Ask your health care provider, or the department that is doing the test: When will my results be ready? How will I get my results? What are my treatment options? What other tests do I need? What are my next steps? Summary The oral glucose tolerance test (GTT) is one of several tests used to diagnose diabetes that develops during pregnancy (gestational diabetes mellitus). Gestational diabetes is a short-term form of diabetes that some women develop while they are pregnant. You may also have this test if you have any symptoms or risk factors for this type of diabetes. Talk with your health care provider about what your results mean. This information is not intended to replace advice given to you by your health care provider. Make sure you discuss any questions you have with your health care provider.   TDaP Vaccine Pregnancy Get the Whooping Cough Vaccine While You Are Pregnant (CDC)  It is important for women to get the whooping cough vaccine in the third trimester of each pregnancy. Vaccines are the best way to prevent this disease. There are 2 different whooping cough vaccines. Both vaccines combine protection against whooping cough, tetanus and diphtheria, but they are for different age groups: Tdap: for everyone 11 years or older, including pregnant women  DTaP: for children 2 months through 6 years of age  You need the whooping cough vaccine during each of your pregnancies The recommended time to get the shot is during your 27th through 36th week of pregnancy, preferably during the earlier part of this time period. The Centers  for Disease Control and Prevention (CDC) recommends that pregnant women receive the whooping cough vaccine for adolescents and adults (called Tdap vaccine) during the third trimester of each pregnancy. The recommended time to get the shot is during your 27th through 36th week of pregnancy, preferably during the earlier part of this time period. This replaces the original recommendation that pregnant women get the vaccine only if they had not previously received it. The American College of Obstetricians and Gynecologists and the American College of Nurse-Midwives support this recommendation.  You should get the whooping cough vaccine while pregnant to pass protection to your baby frame support disabled and/or not supported in this browser  Learn why Laura decided to get the whooping cough vaccine in her 3rd trimester of pregnancy and how her baby girl was born with some protection against the disease. Also available on YouTube. After receiving the whooping cough vaccine, your body will create protective antibodies (proteins produced by the body to fight off diseases) and pass some of them to your baby before birth. These antibodies provide your baby some short-term protection against whooping cough in early life. These antibodies can also protect your baby from some of the more serious complications that come along   with whooping cough. Your protective antibodies are at their highest about 2 weeks after getting the vaccine, but it takes time to pass them to your baby. So the preferred time to get the whooping cough vaccine is early in your third trimester. The amount of whooping cough antibodies in your body decreases over time. That is why CDC recommends you get a whooping cough vaccine during each pregnancy. Doing so allows each of your babies to get the greatest number of protective antibodies from you. This means each of your babies will get the best protection possible against this disease.  Getting  the whooping cough vaccine while pregnant is better than getting the vaccine after you give birth Whooping cough vaccination during pregnancy is ideal so your baby will have short-term protection as soon as he is born. This early protection is important because your baby will not start getting his whooping cough vaccines until he is 2 months old. These first few months of life are when your baby is at greatest risk for catching whooping cough. This is also when he's at greatest risk for having severe, potentially life-threating complications from the infection. To avoid that gap in protection, it is best to get a whooping cough vaccine during pregnancy. You will then pass protection to your baby before he is born. To continue protecting your baby, he should get whooping cough vaccines starting at 2 months old. You may never have gotten the Tdap vaccine before and did not get it during this pregnancy. If so, you should make sure to get the vaccine immediately after you give birth, before leaving the hospital or birthing center. It will take about 2 weeks before your body develops protection (antibodies) in response to the vaccine. Once you have protection from the vaccine, you are less likely to give whooping cough to your newborn while caring for him. But remember, your baby will still be at risk for catching whooping cough from others. A recent study looked to see how effective Tdap was at preventing whooping cough in babies whose mothers got the vaccine while pregnant or in the hospital after giving birth. The study found that getting Tdap between 27 through 36 weeks of pregnancy is 85% more effective at preventing whooping cough in babies younger than 2 months old. Blood tests cannot tell if you need a whooping cough vaccine There are no blood tests that can tell you if you have enough antibodies in your body to protect yourself or your baby against whooping cough. Even if you have been sick with whooping  cough in the past or previously received the vaccine, you still should get the vaccine during each pregnancy. Breastfeeding may pass some protective antibodies onto your baby By breastfeeding, you may pass some antibodies you have made in response to the vaccine to your baby. When you get a whooping cough vaccine during your pregnancy, you will have antibodies in your breast milk that you can share with your baby as soon as your milk comes in. However, your baby will not get protective antibodies immediately if you wait to get the whooping cough vaccine until after delivering your baby. This is because it takes about 2 weeks for your body to create antibodies. Learn more about the health benefits of breastfeeding.  

## 2023-01-18 ENCOUNTER — Encounter: Payer: Self-pay | Admitting: Obstetrics and Gynecology

## 2023-01-30 ENCOUNTER — Encounter: Payer: Self-pay | Admitting: *Deleted

## 2023-02-01 ENCOUNTER — Ambulatory Visit: Payer: BC Managed Care – PPO | Attending: Maternal & Fetal Medicine

## 2023-02-01 ENCOUNTER — Ambulatory Visit: Payer: BC Managed Care – PPO | Admitting: *Deleted

## 2023-02-01 VITALS — BP 117/60 | HR 88

## 2023-02-01 DIAGNOSIS — O09299 Supervision of pregnancy with other poor reproductive or obstetric history, unspecified trimester: Secondary | ICD-10-CM | POA: Diagnosis not present

## 2023-02-01 DIAGNOSIS — O285 Abnormal chromosomal and genetic finding on antenatal screening of mother: Secondary | ICD-10-CM | POA: Diagnosis not present

## 2023-02-01 DIAGNOSIS — D563 Thalassemia minor: Secondary | ICD-10-CM

## 2023-02-01 DIAGNOSIS — O0990 Supervision of high risk pregnancy, unspecified, unspecified trimester: Secondary | ICD-10-CM | POA: Insufficient documentation

## 2023-02-01 DIAGNOSIS — O09293 Supervision of pregnancy with other poor reproductive or obstetric history, third trimester: Secondary | ICD-10-CM

## 2023-02-01 DIAGNOSIS — Z3A28 28 weeks gestation of pregnancy: Secondary | ICD-10-CM

## 2023-02-01 DIAGNOSIS — E669 Obesity, unspecified: Secondary | ICD-10-CM

## 2023-02-01 DIAGNOSIS — O99320 Drug use complicating pregnancy, unspecified trimester: Secondary | ICD-10-CM | POA: Insufficient documentation

## 2023-02-01 DIAGNOSIS — O99213 Obesity complicating pregnancy, third trimester: Secondary | ICD-10-CM

## 2023-02-01 DIAGNOSIS — Z8759 Personal history of other complications of pregnancy, childbirth and the puerperium: Secondary | ICD-10-CM | POA: Insufficient documentation

## 2023-02-01 DIAGNOSIS — F1290 Cannabis use, unspecified, uncomplicated: Secondary | ICD-10-CM | POA: Diagnosis not present

## 2023-02-01 NOTE — Progress Notes (Deleted)
   PRENATAL VISIT NOTE  Subjective:  Amy Singh is a 22 y.o. G3P1011 at [redacted]w[redacted]d being seen today for ongoing prenatal care.  She is currently monitored for the following issues for this high-risk pregnancy and has History of marijuana use; IUFD at less than 20 weeks of gestation; Gonorrhea affecting pregnancy in first trimester; History of severe pre-eclampsia; Short interval between pregnancies affecting pregnancy, antepartum; Supervision of high risk pregnancy, antepartum; and Chlamydia infection affecting pregnancy in first trimester on their problem list.  Patient reports {[id number]}.   .  .   . Denies leaking of fluid.   The following portions of the patient's history were reviewed and updated as appropriate: allergies, current medications, past family history, past medical history, past social history, past surgical history and problem list.   Objective:  There were no vitals filed for this visit.  Fetal Status:           General:  Alert, oriented and cooperative. Patient is in no acute distress.  Skin: Skin is warm and dry. No rash noted.   Cardiovascular: Normal heart rate noted  Respiratory: Normal respiratory effort, no problems with respiration noted  Abdomen: Soft, gravid, appropriate for gestational age.        Pelvic: {Blank single:19197::"Cervical exam performed in the presence of a chaperone","Cervical exam deferred"}        Extremities: Normal range of motion.     Mental Status: Normal mood and affect. Normal behavior. Normal judgment and thought content.   Assessment and Plan:  Pregnancy: G3P1011 at [redacted]w[redacted]d 1. Gonorrhea affecting pregnancy in first trimester TOC negative in 3/24  2. IUFD at less than 20 weeks of gestation  3. Short interval between pregnancies affecting pregnancy, antepartum  4. Supervision of high risk pregnancy, antepartum TWG=15 lb (6.804 kg)   Doing well No concerns today Reports good FM FH is appropriate  5. Chlamydia infection affecting  pregnancy in first trimester TOC was positive, retreated. Repeat TOC today  6. History of PEC - rx for ASA was given but patient ***  {Blank single:19197::"Term","Preterm"} labor symptoms and general obstetric precautions including but not limited to vaginal bleeding, contractions, leaking of fluid and fetal movement were reviewed in detail with the patient. Please refer to After Visit Summary for other counseling recommendations.   No follow-ups on file.  Future Appointments  Date Time Provider Department Center  02/02/2023  8:15 AM [facility] LAB [facility]  02/02/2023  8:55 AM Federico Flake, MD [facility]  02/15/2023  2:30 PM Anyanwu, Jethro Bastos, MD [facility]  02/28/2023  3:30 PM Reva Bores, MD [facility]  03/14/2023  3:30 PM Reva Bores, MD [facility]  03/16/2023  9:30 AM [facility] NURSE [facility]  03/16/2023  9:45 AM [facility] US4 [facility]    Federico Flake, MD

## 2023-02-02 ENCOUNTER — Other Ambulatory Visit: Payer: Self-pay | Admitting: *Deleted

## 2023-02-02 ENCOUNTER — Other Ambulatory Visit: Payer: BC Managed Care – PPO

## 2023-02-02 ENCOUNTER — Telehealth: Payer: Self-pay

## 2023-02-02 ENCOUNTER — Encounter: Payer: BC Managed Care – PPO | Admitting: Family Medicine

## 2023-02-02 DIAGNOSIS — O99320 Drug use complicating pregnancy, unspecified trimester: Secondary | ICD-10-CM

## 2023-02-02 DIAGNOSIS — O021 Missed abortion: Secondary | ICD-10-CM

## 2023-02-02 DIAGNOSIS — O0990 Supervision of high risk pregnancy, unspecified, unspecified trimester: Secondary | ICD-10-CM

## 2023-02-02 DIAGNOSIS — O09899 Supervision of other high risk pregnancies, unspecified trimester: Secondary | ICD-10-CM

## 2023-02-02 DIAGNOSIS — Z8759 Personal history of other complications of pregnancy, childbirth and the puerperium: Secondary | ICD-10-CM

## 2023-02-02 DIAGNOSIS — A749 Chlamydial infection, unspecified: Secondary | ICD-10-CM

## 2023-02-02 DIAGNOSIS — O98211 Gonorrhea complicating pregnancy, first trimester: Secondary | ICD-10-CM

## 2023-02-02 NOTE — Telephone Encounter (Signed)
Left message on voicemail to call and reschedule.

## 2023-02-05 ENCOUNTER — Encounter: Payer: Self-pay | Admitting: Obstetrics and Gynecology

## 2023-02-05 ENCOUNTER — Other Ambulatory Visit: Payer: BC Managed Care – PPO

## 2023-02-05 ENCOUNTER — Encounter: Payer: Self-pay | Admitting: *Deleted

## 2023-02-05 DIAGNOSIS — O0990 Supervision of high risk pregnancy, unspecified, unspecified trimester: Secondary | ICD-10-CM

## 2023-02-05 DIAGNOSIS — O99013 Anemia complicating pregnancy, third trimester: Secondary | ICD-10-CM

## 2023-02-06 ENCOUNTER — Encounter: Payer: Self-pay | Admitting: Obstetrics & Gynecology

## 2023-02-06 LAB — CBC
Hematocrit: 30.9 % — ABNORMAL LOW (ref 34.0–46.6)
Hemoglobin: 9.7 g/dL — ABNORMAL LOW (ref 11.1–15.9)
MCH: 26.3 pg — ABNORMAL LOW (ref 26.6–33.0)
MCHC: 31.4 g/dL — ABNORMAL LOW (ref 31.5–35.7)
MCV: 84 fL (ref 79–97)
Platelets: 196 10*3/uL (ref 150–450)
RBC: 3.69 x10E6/uL — ABNORMAL LOW (ref 3.77–5.28)
RDW: 13.1 % (ref 11.7–15.4)
WBC: 11.2 10*3/uL — ABNORMAL HIGH (ref 3.4–10.8)

## 2023-02-06 LAB — GLUCOSE TOLERANCE, 2 HOURS W/ 1HR
Glucose, 1 hour: 66 mg/dL — ABNORMAL LOW (ref 70–179)
Glucose, 2 hour: 76 mg/dL (ref 70–152)
Glucose, Fasting: 80 mg/dL (ref 70–91)

## 2023-02-06 LAB — RPR: RPR Ser Ql: NONREACTIVE

## 2023-02-06 LAB — HIV ANTIBODY (ROUTINE TESTING W REFLEX): HIV Screen 4th Generation wRfx: NONREACTIVE

## 2023-02-07 ENCOUNTER — Inpatient Hospital Stay (HOSPITAL_COMMUNITY)
Admission: AD | Admit: 2023-02-07 | Discharge: 2023-02-07 | Disposition: A | Discharge status: Home / Self Care | Payer: BC Managed Care – PPO | Attending: Obstetrics and Gynecology | Admitting: Obstetrics and Gynecology

## 2023-02-07 ENCOUNTER — Encounter (HOSPITAL_COMMUNITY): Payer: Self-pay | Admitting: Obstetrics and Gynecology

## 2023-02-07 ENCOUNTER — Inpatient Hospital Stay (HOSPITAL_BASED_OUTPATIENT_CLINIC_OR_DEPARTMENT_OTHER): Payer: BC Managed Care – PPO

## 2023-02-07 DIAGNOSIS — O285 Abnormal chromosomal and genetic finding on antenatal screening of mother: Secondary | ICD-10-CM

## 2023-02-07 DIAGNOSIS — Z3689 Encounter for other specified antenatal screening: Secondary | ICD-10-CM

## 2023-02-07 DIAGNOSIS — O99213 Obesity complicating pregnancy, third trimester: Secondary | ICD-10-CM

## 2023-02-07 DIAGNOSIS — O0990 Supervision of high risk pregnancy, unspecified, unspecified trimester: Secondary | ICD-10-CM

## 2023-02-07 DIAGNOSIS — Z6790 Unspecified blood type, Rh positive: Secondary | ICD-10-CM

## 2023-02-07 DIAGNOSIS — E669 Obesity, unspecified: Secondary | ICD-10-CM

## 2023-02-07 DIAGNOSIS — O26853 Spotting complicating pregnancy, third trimester: Secondary | ICD-10-CM | POA: Diagnosis not present

## 2023-02-07 DIAGNOSIS — Z3A29 29 weeks gestation of pregnancy: Secondary | ICD-10-CM

## 2023-02-07 DIAGNOSIS — D563 Thalassemia minor: Secondary | ICD-10-CM | POA: Diagnosis not present

## 2023-02-07 DIAGNOSIS — O4693 Antepartum hemorrhage, unspecified, third trimester: Secondary | ICD-10-CM | POA: Insufficient documentation

## 2023-02-07 DIAGNOSIS — O09293 Supervision of pregnancy with other poor reproductive or obstetric history, third trimester: Secondary | ICD-10-CM

## 2023-02-07 DIAGNOSIS — O26859 Spotting complicating pregnancy, unspecified trimester: Secondary | ICD-10-CM

## 2023-02-07 LAB — WET PREP, GENITAL
Clue Cells Wet Prep HPF POC: NONE SEEN
Sperm: NONE SEEN
Trich, Wet Prep: NONE SEEN
WBC, Wet Prep HPF POC: <10 (ref <10)
Yeast Wet Prep HPF POC: NONE SEEN

## 2023-02-07 LAB — URINALYSIS, ROUTINE W REFLEX MICROSCOPIC
Bilirubin Urine: NEGATIVE
Glucose, UA: NEGATIVE mg/dL
Ketones, ur: NEGATIVE mg/dL
Leukocytes,Ua: NEGATIVE
Nitrite: NEGATIVE
Protein, ur: NEGATIVE mg/dL
Specific Gravity, Urine: 1.004 — ABNORMAL LOW (ref 1.005–1.030)
pH: 7 (ref 5.0–8.0)

## 2023-02-07 MED ORDER — ACETAMINOPHEN 500 MG PO TABS
1000.0000 mg | ORAL_TABLET | Freq: Once | ORAL | Status: AC
  Administered 2023-02-07: 1000 mg via ORAL
  Filled 2023-02-07: qty 2

## 2023-02-07 NOTE — MAU Note (Signed)
Pt says at 0400- she went to b-room- wiped - saw reddish-pink on toilet paper . Then at 0515- saw again when she wiped . Then at 0620- none when she wiped [facility] Last sex- last week Was cramping at 0400- but now feels pressure .

## 2023-02-07 NOTE — MAU Provider Note (Addendum)
History     [id number]  Arrival date and time: 02/07/23 4098    Chief Complaint  Patient presents with   Vaginal Bleeding     HPI Amy Singh is a 22 y.o. G3P1011 at [redacted]w[redacted]d by ultrasound who presents for vaginal bleeding. Reports 2 episodes of pink spotting on toilet paper this morning. No longer seeing blood when she wipes but was encouraged to come have it evaluated. Denies abdominal pain, n/v/d, dysuria, vaginal discharge,LOF, or recent intercourse. Reports good fetal movement.  States she did take her chlamydia treatment last month & her partner was treated as well. Last had intercourse with him last week.   A/Positive/-- (01/18 1050)  OB History     Gravida  3   Para  1   Term  1   Preterm      AB  1   Living  1      SAB  1   IAB      Ectopic      Multiple  0   Live Births  1           Past Medical History:  Diagnosis Date   Alpha thalassemia silent carrier 03/24/2021   On horizon 4   Chlamydia infection affecting pregnancy 03/16/2021   neg toc 04/2021   Gonorrhea affecting pregnancy 03/16/2021   [x ] toc late July: neg toc    History of chlamydia    History of hyperemesis gravidarum 10/20/2020   26lb weight loss in first pregnanc   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   History of molluscum contagiosum    IUFD at less than 20 weeks of gestation 11/18/2020   Neg lupus anti-coag, anti-phospholipid antibodies, anti-cardiolipin, anti-beta2 glycoprotein    Severe pre-eclampsia 09/21/2021   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   Qualifier: Diagnosis of  By: Barbaraann Barthel MD, [place]      Past Surgical History:  Procedure Laterality Date   WISDOM TOOTH EXTRACTION      Family History  Problem Relation Age of Onset   Hypertension Paternal Grandmother    Diabetes Paternal Grandmother     Social History   Socioeconomic History   Marital status: Single    Spouse name: Not on file   Number of children: Not on file   Years of education: Not on file    Highest education level: Not on file  Occupational History   Not on file  Tobacco Use   Smoking status: Never   Smokeless tobacco: Never  Vaping Use   Vaping Use: Never used  Substance and Sexual Activity   Alcohol use: No   Drug use: Not Currently    Types: Marijuana    Comment: 09/14/22   Sexual activity: Yes    Partners: Male    Comment: Pregnant   Other Topics Concern   Not on file  Social History Narrative   Not on file   Social Determinants of Health   Financial Resource Strain: Not on file  Food Insecurity: Not on file  Transportation Needs: Not on file  Physical Activity: Not on file  Stress: Not on file  Social Connections: Not on file  Intimate Partner Violence: Not on file    No Known Allergies  No current facility-administered medications on file prior to encounter.   Current Outpatient Medications on File Prior to Encounter  Medication Sig Dispense Refill   aspirin EC 81 MG tablet Take 1 tablet (81 mg total) by mouth daily. (Patient not taking: Reported  on 11/13/2022) 60 tablet 2   ondansetron (ZOFRAN-ODT) 4 MG disintegrating tablet Take 1 tablet (4 mg total) by mouth every 6 (six) hours as needed for nausea. (Patient not taking: Reported on 12/28/2022) 20 tablet 0     ROS Pertinent positives and negative per HPI, all others reviewed and negative  Physical Exam   BP 111/64   Pulse 84   Temp 98.1 F (36.7 C) (Oral)   Resp 16   Ht 5\' 2"  (1.575 m)   Wt 87.1 kg   LMP 07/25/2022 (Approximate)   BMI 35.14 kg/m   Patient Vitals for the past 24 hrs:  BP Temp Temp src Pulse Resp Height Weight  02/07/23 0714 111/64 -- -- 84 -- -- --  02/07/23 0646 116/64 98.1 F (36.7 C) Oral 84 16 5\' 2"  (1.575 m) 87.1 kg    Physical Exam Vitals and nursing note reviewed.  Constitutional:      General: She is not in acute distress.    Appearance: She is well-developed. She is not ill-appearing.  HENT:     Head: Normocephalic and atraumatic.  Eyes:      General: No scleral icterus.       Right eye: No discharge.        Left eye: No discharge.     Conjunctiva/sclera: Conjunctivae normal.  Pulmonary:     Effort: Pulmonary effort is normal. No respiratory distress.  Neurological:     General: No focal deficit present.     Mental Status: She is alert.  Psychiatric:        Mood and Affect: Mood normal.        Behavior: Behavior normal.       FHT Baseline 145, moderate variability, 10x10 accels, no decels Toco: none Cat: 1  Labs No results found for this or any previous visit (from the past 24 hour(s)).  Imaging No results found.  MAU Course  Procedures Lab Orders         Wet prep, genital         Urinalysis, Routine w reflex microscopic -Urine, Clean Catch     No orders of the defined types were placed in this encounter.  Imaging Orders  No imaging studies ordered today    MDM Patient presents for vaginal spotting that resolved prior to arrival. She is RH positive. Treated for chlamydia last month but has been active with same partner since then. Wet prep & GC/CT ordered & will have pelvic exam.   Care turned over to [facility] Judeth Horn, NP 02/07/2023 7:56 AM   --Reita Cliche, CNM at bedside shortly after assumption of care for introduction and pelvic exam. External genitalia normal, vaginal walls pink and well rugated, cervix visually closed, no lesions noted. Bimanual: cervix 0/thick/posterior, neg CMT, uterus nontender, no adnexal tenderness noted.  --Patient reports vomiting shortly after initial treatment for Chlamydia. She states that the appointment prior to her 2 hour GTT she was advised to retreat. She did not vomit after that administration. Her partner has not been retreated since patient's original administration (same occurrence where she vomited). Advised patient to discuss possible indication for partner retreatment, pending results of today's GC swab. Advised abstinence until results of today's swab  are obtained.   Results for orders placed or performed during the hospital encounter of 02/07/23 (from the past 24 hour(s))  Wet prep, genital     Status: None   Collection Time: 02/07/23  7:53 AM  Result Value Ref Range  Yeast Wet Prep HPF POC NONE SEEN NONE SEEN   Trich, Wet Prep NONE SEEN NONE SEEN   Clue Cells Wet Prep HPF POC NONE SEEN NONE SEEN   WBC, Wet Prep HPF POC <10 <10   Sperm NONE SEEN   Urinalysis, Routine w reflex microscopic -Urine, Clean Catch     Status: Abnormal   Collection Time: 02/07/23  7:58 AM  Result Value Ref Range   Color, Urine STRAW (A) YELLOW   APPearance CLEAR CLEAR   Specific Gravity, Urine 1.004 (L) 1.005 - 1.030   pH 7.0 5.0 - 8.0   Glucose, UA NEGATIVE NEGATIVE mg/dL   Hgb urine dipstick SMALL (A) NEGATIVE   Bilirubin Urine NEGATIVE NEGATIVE   Ketones, ur NEGATIVE NEGATIVE mg/dL   Protein, ur NEGATIVE NEGATIVE mg/dL   Nitrite NEGATIVE NEGATIVE   Leukocytes,Ua NEGATIVE NEGATIVE   RBC / HPF 0-5 0 - 5 RBC/hpf   WBC, UA 0-5 0 - 5 WBC/hpf   Bacteria, UA RARE (A) NONE SEEN   Squamous Epithelial / HPF 0-5 0 - 5 /HPF   Assessment and Plan  --22 y.o. G3P1011 at [redacted]w[redacted]d  --Vaginal spotting --Reactive tracing --Closed cervix --No acute findings on MFM Ob Limited US --Anterior placenta, Rh POS --Possible indication for retreating for Chlamydia, GC swab in work --Discharge home in stable condition, patient given work note for today  F/U: --Next OB appointment 02/09/2023  Clayton Bibles, MSA, MSN, CNM Certified Nurse Midwife, [facility] for [employer], [facility]

## 2023-02-08 LAB — CULTURE, OB URINE: Culture: NO GROWTH

## 2023-02-08 LAB — GC/CHLAMYDIA PROBE AMP (~~LOC~~) NOT AT ARMC
Chlamydia: NEGATIVE
Comment: NEGATIVE
Comment: NORMAL
Neisseria Gonorrhea: NEGATIVE

## 2023-02-09 ENCOUNTER — Encounter: Payer: Self-pay | Admitting: Obstetrics & Gynecology

## 2023-02-09 ENCOUNTER — Ambulatory Visit (INDEPENDENT_AMBULATORY_CARE_PROVIDER_SITE_OTHER): Payer: BC Managed Care – PPO | Admitting: Obstetrics & Gynecology

## 2023-02-09 VITALS — BP 113/72 | HR 85 | Wt 191.0 lb

## 2023-02-09 DIAGNOSIS — O99013 Anemia complicating pregnancy, third trimester: Secondary | ICD-10-CM

## 2023-02-09 DIAGNOSIS — Z8759 Personal history of other complications of pregnancy, childbirth and the puerperium: Secondary | ICD-10-CM

## 2023-02-09 DIAGNOSIS — Z3A29 29 weeks gestation of pregnancy: Secondary | ICD-10-CM

## 2023-02-09 DIAGNOSIS — O0990 Supervision of high risk pregnancy, unspecified, unspecified trimester: Secondary | ICD-10-CM

## 2023-02-09 NOTE — Progress Notes (Signed)
PRENATAL VISIT NOTE  Subjective:  Amy Singh is a 22 y.o. G3P1011 at [redacted]w[redacted]d being seen today for ongoing prenatal care.  She is currently monitored for the following issues for this high-risk pregnancy and has History of marijuana use; IUFD at less than 20 weeks of gestation; Gonorrhea affecting pregnancy in first trimester; Anemia affecting pregnancy in third trimester; History of severe pre-eclampsia; Short interval between pregnancies affecting pregnancy, antepartum; Supervision of high risk pregnancy, antepartum; and Chlamydia infection affecting pregnancy in first trimester on their problem list.  Patient reports no complaints.  Contractions: Not present. Vag. Bleeding: None.  Movement: Present. Denies leaking of fluid.   The following portions of the patient's history were reviewed and updated as appropriate: allergies, current medications, past family history, past medical history, past social history, past surgical history and problem list.   Objective:   Vitals:   02/09/23 0916  BP: 113/72  Pulse: 85  Weight: 191 lb (86.6 kg)    Fetal Status: Fetal Heart Rate (bpm): 152   Movement: Present     General:  Alert, oriented and cooperative. Patient is in no acute distress.  Skin: Skin is warm and dry. No rash noted.   Cardiovascular: Normal heart rate noted  Respiratory: Normal respiratory effort, no problems with respiration noted  Abdomen: Soft, gravid, appropriate for gestational age.  Pain/Pressure: Present     Pelvic: Cervical exam deferred        Extremities: Normal range of motion.  Edema: Trace  Mental Status: Normal mood and affect. Normal behavior. Normal judgment and thought content.   [place] MFM OB Limited  Result Date: 02/07/2023 ----------------------------------------------------------------------  OBSTETRICS REPORT                       (Signed Final 02/07/2023 01:25 pm) ---------------------------------------------------------------------- Patient Info  ID #:        [id number]                          D.O.B.:  10/08/2000 (21 yrs)  Name:       Amy Singh                  Visit Date: 02/07/2023 08:05 am ---------------------------------------------------------------------- Performed By  Attending:        Ma Rings MD         Ref. Address:     [address]                                                             [place], [place]                                                             [address]  Performed By:     Percell Boston          [facility]  Referred By:      Glendo Bing MD ---------------------------------------------------------------------- Orders  #  Description                           Code        Ordered By  1  [place] MFM OB LIMITED                     [address].60    Clayton Bibles ----------------------------------------------------------------------  #  Order #                     Accession #                Episode #  1  [id number]                   [id number]                 [id number] ---------------------------------------------------------------------- Indications  Vaginal bleeding in pregnancy, third trimester O46.93  Obesity complicating pregnancy, second         O99.212  trimester (pre-G BMI 30)  Poor obstetric history: Previous severe        O09.299  preeclampsia  Genetic carrier (Silent carrier Alpha Thal)    Z14.8  LR NIPS - Female, Negative AFP  [redacted] weeks gestation of pregnancy                Z3A.29 ---------------------------------------------------------------------- Fetal Evaluation  Num Of Fetuses:         1  Fetal Heart Rate(bpm):  133  Cardiac Activity:       Observed  Presentation:           Cephalic  Placenta:               Anterior  P. Cord Insertion:      Visualized, central  Amniotic Fluid  AFI FV:      Within normal limits  AFI Sum(cm)     %Tile       Largest Pocket(cm)  15.3             54          4.8  RUQ(cm)       RLQ(cm)       LUQ(cm)        LLQ(cm)  3.5           3.8           4.8            3.2  Comment:    No placental abruption or previa identified. ---------------------------------------------------------------------- OB History  Gravidity:    3         Term:   1        Prem:   0        SAB:   1  TOP:          0       Ectopic:  0  Living: 1 ---------------------------------------------------------------------- Gestational Age  LMP:           28w 1d        Date:  07/25/22                 EDD:   05/01/23  Best:          29w 4d     Det. By:  Marcella Dubs         EDD:   04/21/23                                      (09/29/22) ---------------------------------------------------------------------- Anatomy  Cranium:               Appears normal         Diaphragm:              Appears normal  Ventricles:            Appears normal         Stomach:                Appears normal, left                                                                        sided  Thoracic:              Appears normal         Bladder:                Appears normal ---------------------------------------------------------------------- Cervix Uterus Adnexa  Cervix  Not visualized (advanced GA >24wks)  Uterus  No abnormality visualized.  Right Ovary  No adnexal mass visualized.  Left Ovary  No adnexal mass visualized.  Cul De Sac  No free fluid seen.  Adnexa  No abnormality visualized ---------------------------------------------------------------------- Comments  This patient presented to the MAU due to vaginal spotting.  A limited ultrasound performed today shows that the fetus is  in the vertex presentation.  There was normal amniotic fluid noted.  A normal-appearing anterior placenta is noted.  There were  no signs of placenta previa noted today. ----------------------------------------------------------------------                   Ma Rings, MD Electronically Signed Final Report   02/07/2023 01:25  pm ----------------------------------------------------------------------  [place] MFM OB FOLLOW UP  Result Date: 02/01/2023 ----------------------------------------------------------------------  OBSTETRICS REPORT                       (Signed Final 02/01/2023 04:19 pm) ---------------------------------------------------------------------- Patient Info  ID #:       [id number]                          D.O.B.:  04-Jun-2001 (21 yrs)  Name:       Amy Singh                  Visit Date: 02/01/2023 04:11 pm ---------------------------------------------------------------------- Performed By  Attending:        Noralee Space MD        Ref. Address:  [address]                                                             [place], [place]                                                             [address]  Performed By:     Tommie Raymond BS,       [facility]  Referred By:      Schubert Bing MD ---------------------------------------------------------------------- Orders  #  Description                           Code        Ordered By  1  [place] MFM OB FOLLOW UP                   60454.09    Braxton Feathers ----------------------------------------------------------------------  #  Order #                     Accession #                Episode #  1  [id number]                   [id number]                 [id number] ---------------------------------------------------------------------- Indications  [redacted] weeks gestation of pregnancy                Z3A.28  Obesity complicating pregnancy, second         O99.212  trimester (pre-G BMI 30)  Poor obstetric history: Previous severe        O09.299  preeclampsia  Genetic carrier (Silent carrier Alpha Thal)    Z14.8  LR NIPS - Female, Negative AFP  ----------------------------------------------------------------------  Fetal Evaluation  Num Of Fetuses:         1  Fetal Heart Rate(bpm):  138  Cardiac Activity:       Observed  Placenta:               Anterior  P. Cord Insertion:      Previously visualized  Amniotic Fluid  AFI FV:      Subjectively upper-normal  AFI Sum(cm)     %Tile       Largest Pocket(cm)  23.24           95          6.74  RUQ(cm)       RLQ(cm)       LUQ(cm)        LLQ(cm)  6.74          6.4           5.8            4.3 ---------------------------------------------------------------------- Biometry  BPD:      72.9  mm     G. Age:  29w 2d         55  %    CI:        70.63   %    70 - 86                                                          FL/HC:      19.3   %    19.6 - 20.8  HC:      276.5  mm     G. Age:  30w 2d         64  %    HC/AC:      1.08        0.99 - 1.21  AC:      255.7  mm     G. Age:  29w 5d         74  %    FL/BPD:     73.1   %    71 - 87  FL:       53.3  mm     G. Age:  28w 2d         23  %    FL/AC:      20.8   %    20 - 24  LV:        3.3  mm  Est. FW:    1367  gm           3 lb     59  % ---------------------------------------------------------------------- OB History  Gravidity:    3         Term:   1        Prem:   0        SAB:   1  TOP:          0       Ectopic:  0        Living: 1 ---------------------------------------------------------------------- Gestational Age  LMP:           27w 2d        Date:  07/25/22  EDD:   05/01/23  U/S Today:     29w 3d                                        EDD:   04/16/23  Best:          28w 5d     Det. ByMarcella Dubs         EDD:   04/21/23                                      (09/29/22) ---------------------------------------------------------------------- Anatomy  Cranium:               Appears normal         LVOT:                   Previously seen  Cavum:                 Appears normal         Aortic Arch:            Previously seen  Ventricles:            Appears  normal         Ductal Arch:            Previously seen  Choroid Plexus:        Previously seen        Diaphragm:              Appears normal  Cerebellum:            Previously seen        Stomach:                Appears normal, left                                                                        sided  Posterior Fossa:       Previously seen        Abdomen:                Appears normal  Nuchal Fold:           Previously seen        Abdominal Wall:         Previously seen  Face:                  Orbits and profile     Cord Vessels:           Previously seen                         previously seen  Lips:                  Previously seen        Kidneys:                Appear normal  Palate:  Previously             Bladder:                Appears normal                         visualized  Thoracic:              Appears normal         Spine:                  Previously seen  Heart:                 Appears normal         Upper Extremities:      Previously seen                         (4CH, axis, and                         situs)  RVOT:                  Previously seen        Lower Extremities:      Previously seen  Other:  Female gender, Heels/feet, open hands/5th digits, lenses, maxilla,          mandible and falx, VC, 3VV, and 3VTV  previously visualized. ---------------------------------------------------------------------- Cervix Uterus Adnexa  Cervix  Not visualized (advanced GA >24wks)  Uterus  No abnormality visualized.  Right Ovary  Within normal limits.  Left Ovary  Within normal limits.  Cul De Sac  No free fluid seen.  Adnexa  No abnormality visualized ---------------------------------------------------------------------- Impression  History of preeclampsia with severe features.  Fetal growth is appropriate for gestational age .Amniotic fluid  is normal and good fetal activity is seen . ---------------------------------------------------------------------- Recommendations  -An appointment  was made for her to return in 6 weeks for  fetal growth assessment. ----------------------------------------------------------------------                 Noralee Space, MD Electronically Signed Final Report   02/01/2023 04:19 pm ----------------------------------------------------------------------   Assessment and Plan:  Pregnancy: G3P1011 at [redacted]w[redacted]d 1. Anemia affecting pregnancy in third trimester Hgb 9.7, ordered for IV infusions.  2. History of severe pre-eclampsia Normotensive today, continue to monitor.   3. [redacted] weeks gestation of pregnancy 4. Supervision of high risk pregnancy, antepartum No other concerns.  Preterm labor symptoms and general obstetric precautions including but not limited to vaginal bleeding, contractions, leaking of fluid and fetal movement were reviewed in detail with the patient. Please refer to After Visit Summary for other counseling recommendations.   Return in about 2 weeks (around 02/23/2023) for OFFICE OB VISIT (MD or APP).  Future Appointments  Date Time Provider Department Center  02/12/2023 10:00 AM [facility] None  02/15/2023  2:30 PM Nirel Babler, Jethro Bastos, MD [facility]  02/20/2023 10:00 AM [facility] None  02/28/2023  3:30 PM Reva Bores, MD [facility]  03/14/2023  3:30 PM Reva Bores, MD [facility]  03/16/2023  9:30 AM [facility] NURSE [facility]  03/16/2023  9:45 AM [facility] US4 [facility]    Jaynie Collins, MD

## 2023-02-09 NOTE — Patient Instructions (Signed)
Return to office for any scheduled appointments. Call the office or go to the MAU at [facility] if: You begin to have strong, frequent contractions Your water breaks.  Sometimes it is a big gush of fluid, sometimes it is just a trickle that keeps getting your underwear wet or running down your legs You have vaginal bleeding.  It is normal to have a small amount of spotting if your cervix was checked.  You do not feel your baby moving like normal.  If you do not, get something to eat and drink and lay down and focus on feeling your baby move.   If your baby is still not moving like normal, you should call the office or go to MAU. Any other obstetric concerns.  

## 2023-02-12 ENCOUNTER — Ambulatory Visit (HOSPITAL_COMMUNITY)
Admission: RE | Admit: 2023-02-12 | Discharge: 2023-02-12 | Disposition: A | Discharge status: Home / Self Care | Payer: BC Managed Care – PPO | Source: Ambulatory Visit | Attending: Obstetrics & Gynecology | Admitting: Obstetrics & Gynecology

## 2023-02-12 DIAGNOSIS — O99013 Anemia complicating pregnancy, third trimester: Secondary | ICD-10-CM | POA: Diagnosis not present

## 2023-02-12 MED ORDER — METHYLPREDNISOLONE SODIUM SUCC 125 MG IJ SOLR
INTRAMUSCULAR | Status: AC
  Administered 2023-02-12: 125 mg via INTRAVENOUS
  Filled 2023-02-12: qty 2

## 2023-02-12 MED ORDER — SODIUM CHLORIDE 0.9 % IV SOLN
300.0000 mg | INTRAVENOUS | Status: DC
  Administered 2023-02-12: 300 mg via INTRAVENOUS
  Filled 2023-02-12: qty 300

## 2023-02-12 MED ORDER — ACETAMINOPHEN 500 MG PO TABS: 1000.0000 mg | ORAL_TABLET | Freq: Once | ORAL | Status: AC

## 2023-02-12 MED ORDER — LORATADINE 10 MG PO TABS: 10.0000 mg | ORAL_TABLET | Freq: Once | ORAL | Status: AC

## 2023-02-12 MED ORDER — METHYLPREDNISOLONE SODIUM SUCC 125 MG IJ SOLR: 125.0000 mg | Freq: Once | INTRAMUSCULAR | Status: AC

## 2023-02-12 MED ORDER — LORATADINE 10 MG PO TABS
ORAL_TABLET | ORAL | Status: AC
  Administered 2023-02-12: 10 mg via ORAL
  Filled 2023-02-12: qty 1

## 2023-02-12 MED ORDER — ACETAMINOPHEN 325 MG PO TABS
ORAL_TABLET | ORAL | Status: AC
  Administered 2023-02-12: 975 mg via ORAL
  Filled 2023-02-12: qty 3

## 2023-02-12 MED ORDER — IRON SUCROSE 500 MG IVPB - SIMPLE MED
300.0000 mg | INTRAVENOUS | Status: DC
  Filled 2023-02-12: qty 275

## 2023-02-15 ENCOUNTER — Encounter: Payer: BC Managed Care – PPO | Admitting: Obstetrics & Gynecology

## 2023-02-15 ENCOUNTER — Other Ambulatory Visit (HOSPITAL_COMMUNITY): Payer: Self-pay | Admitting: *Deleted

## 2023-02-16 ENCOUNTER — Encounter: Payer: Self-pay | Admitting: Obstetrics and Gynecology

## 2023-02-16 ENCOUNTER — Other Ambulatory Visit: Payer: Self-pay

## 2023-02-16 DIAGNOSIS — R110 Nausea: Secondary | ICD-10-CM

## 2023-02-16 DIAGNOSIS — O26893 Other specified pregnancy related conditions, third trimester: Secondary | ICD-10-CM

## 2023-02-16 MED ORDER — PROMETHAZINE HCL 25 MG PO TABS
25.0000 mg | ORAL_TABLET | Freq: Four times a day (QID) | ORAL | 1 refills | Status: DC | PRN
Start: 2023-02-16 — End: 2023-04-02

## 2023-02-16 MED ORDER — PANTOPRAZOLE SODIUM 40 MG PO TBEC
40.00 mg | DELAYED_RELEASE_TABLET | Freq: Every day | ORAL | 1 refills | Status: DC
Start: 2023-02-16 — End: 2023-04-02

## 2023-02-16 NOTE — Progress Notes (Signed)
Consulted with provider on Rx for heartburn.  Ok to send Protonix 40mg .  Pt made aware Via mychart.

## 2023-02-20 ENCOUNTER — Ambulatory Visit (HOSPITAL_COMMUNITY)
Admission: RE | Admit: 2023-02-20 | Discharge: 2023-02-20 | Disposition: A | Discharge status: Home / Self Care | Payer: BC Managed Care – PPO | Source: Ambulatory Visit | Attending: Obstetrics & Gynecology | Admitting: Obstetrics & Gynecology

## 2023-02-20 DIAGNOSIS — O99013 Anemia complicating pregnancy, third trimester: Secondary | ICD-10-CM

## 2023-02-20 MED ORDER — LORATADINE 10 MG PO TABS
10.0000 mg | ORAL_TABLET | Freq: Once | ORAL | Status: AC
  Administered 2023-02-20: 10 mg via ORAL

## 2023-02-20 MED ORDER — ACETAMINOPHEN 500 MG PO TABS
ORAL_TABLET | ORAL | Status: AC
  Filled 2023-02-20: qty 2

## 2023-02-20 MED ORDER — METHYLPREDNISOLONE SODIUM SUCC 125 MG IJ SOLR
INTRAMUSCULAR | Status: AC
  Administered 2023-02-20: 125 mg via INTRAVENOUS
  Filled 2023-02-20: qty 2

## 2023-02-20 MED ORDER — LORATADINE 10 MG PO TABS
ORAL_TABLET | ORAL | Status: AC
  Filled 2023-02-20: qty 1

## 2023-02-20 MED ORDER — ACETAMINOPHEN 500 MG PO TABS
1000.0000 mg | ORAL_TABLET | Freq: Four times a day (QID) | ORAL | Status: DC | PRN
  Administered 2023-02-20: 1000 mg via ORAL

## 2023-02-20 MED ORDER — SODIUM CHLORIDE 0.9 % IV SOLN
300.0000 mg | INTRAVENOUS | Status: DC
  Administered 2023-02-20: 300 mg via INTRAVENOUS
  Filled 2023-02-20: qty 15

## 2023-02-20 MED ORDER — METHYLPREDNISOLONE SODIUM SUCC 125 MG IJ SOLR: 125.0000 mg | Freq: Once | INTRAMUSCULAR | Status: AC

## 2023-02-21 ENCOUNTER — Ambulatory Visit (INDEPENDENT_AMBULATORY_CARE_PROVIDER_SITE_OTHER): Payer: BC Managed Care – PPO | Admitting: Family Medicine

## 2023-02-21 VITALS — BP 115/68 | HR 81 | Wt 199.0 lb

## 2023-02-21 DIAGNOSIS — O0990 Supervision of high risk pregnancy, unspecified, unspecified trimester: Secondary | ICD-10-CM

## 2023-02-21 DIAGNOSIS — Z8759 Personal history of other complications of pregnancy, childbirth and the puerperium: Secondary | ICD-10-CM

## 2023-02-21 DIAGNOSIS — O09899 Supervision of other high risk pregnancies, unspecified trimester: Secondary | ICD-10-CM

## 2023-02-21 NOTE — Progress Notes (Signed)
   PRENATAL VISIT NOTE  Subjective:  Amy Singh is a 22 y.o. G3P1011 at [redacted]w[redacted]d being seen today for ongoing prenatal care.  She is currently monitored for the following issues for this low-risk pregnancy and has History of marijuana use; IUFD at less than 20 weeks of gestation; Gonorrhea affecting pregnancy in first trimester; Anemia affecting pregnancy in third trimester; History of severe pre-eclampsia; Short interval between pregnancies affecting pregnancy, antepartum; Supervision of high risk pregnancy, antepartum; and Chlamydia infection affecting pregnancy in first trimester on their problem list.  Patient reports no complaints.  Contractions: Not present. Vag. Bleeding: None.  Movement: Present. Denies leaking of fluid.   The following portions of the patient's history were reviewed and updated as appropriate: allergies, current medications, past family history, past medical history, past social history, past surgical history and problem list.   Objective:   Vitals:   02/21/23 1542  BP: 115/68  Pulse: 81  Weight: 199 lb (90.3 kg)    Fetal Status: Fetal Heart Rate (bpm): 150 Fundal Height: 32 cm Movement: Present     General:  Alert, oriented and cooperative. Patient is in no acute distress.  Skin: Skin is warm and dry. No rash noted.   Cardiovascular: Normal heart rate noted  Respiratory: Normal respiratory effort, no problems with respiration noted  Abdomen: Soft, gravid, appropriate for gestational age.  Pain/Pressure: Present (Randomly)     Pelvic: Cervical exam deferred        Extremities: Normal range of motion.  Edema: Trace  Mental Status: Normal mood and affect. Normal behavior. Normal judgment and thought content.   Assessment and Plan:  Pregnancy: G3P1011 at [redacted]w[redacted]d 1. Supervision of high risk pregnancy, antepartum Continue routine prenatal care.  2. History of severe pre-eclampsia BP is WNL today  3. Short interval between pregnancies affecting pregnancy,  antepartum   Preterm labor symptoms and general obstetric precautions including but not limited to vaginal bleeding, contractions, leaking of fluid and fetal movement were reviewed in detail with the patient. Please refer to After Visit Summary for other counseling recommendations.   Return in 2 weeks (on 03/07/2023).  Future Appointments  Date Time Provider Department Center  02/27/2023  9:00 AM [facility] None  03/06/2023  4:10 PM [place] Bing, MD [facility]  03/16/2023  9:30 AM [facility] NURSE [facility]  03/16/2023  9:45 AM [facility] US4 [facility]  03/20/2023  3:30 PM [place] Bing, MD [facility]  03/28/2023  3:50 PM [place] Bing, MD [facility]  04/04/2023  3:50 PM Reva Bores, MD [facility]  04/11/2023  3:50 PM Anyanwu, Jethro Bastos, MD [facility]  04/18/2023  4:10 PM Reva Bores, MD [facility]    Reva Bores, MD

## 2023-02-21 NOTE — Progress Notes (Signed)
CC: Pt stating that she is throwing up still

## 2023-02-27 ENCOUNTER — Ambulatory Visit (HOSPITAL_COMMUNITY)
Admission: RE | Admit: 2023-02-27 | Discharge: 2023-02-27 | Disposition: A | Discharge status: Home / Self Care | Payer: BC Managed Care – PPO | Source: Ambulatory Visit | Attending: Obstetrics & Gynecology | Admitting: Obstetrics & Gynecology

## 2023-02-27 DIAGNOSIS — O99013 Anemia complicating pregnancy, third trimester: Secondary | ICD-10-CM

## 2023-02-27 MED ORDER — METHYLPREDNISOLONE SODIUM SUCC 125 MG IJ SOLR
INTRAMUSCULAR | Status: AC
  Filled 2023-02-27: qty 2

## 2023-02-27 MED ORDER — ACETAMINOPHEN 500 MG PO TABS
1000.0000 mg | ORAL_TABLET | Freq: Four times a day (QID) | ORAL | Status: DC | PRN
  Administered 2023-02-27: 1000 mg via ORAL

## 2023-02-27 MED ORDER — SODIUM CHLORIDE 0.9 % IV SOLN
300.0000 mg | INTRAVENOUS | Status: DC
  Administered 2023-02-27: 300 mg via INTRAVENOUS
  Filled 2023-02-27: qty 15

## 2023-02-27 MED ORDER — ACETAMINOPHEN 500 MG PO TABS
ORAL_TABLET | ORAL | Status: AC
  Filled 2023-02-27: qty 2

## 2023-02-27 MED ORDER — LORATADINE 10 MG PO TABS
10.0000 mg | ORAL_TABLET | Freq: Once | ORAL | Status: AC
  Administered 2023-02-27: 10 mg via ORAL

## 2023-02-27 MED ORDER — LORATADINE 10 MG PO TABS
ORAL_TABLET | ORAL | Status: AC
  Filled 2023-02-27: qty 1

## 2023-02-27 MED ORDER — METHYLPREDNISOLONE SODIUM SUCC 125 MG IJ SOLR
125.0000 mg | Freq: Once | INTRAMUSCULAR | Status: AC
  Administered 2023-02-27: 125 mg via INTRAVENOUS

## 2023-02-28 ENCOUNTER — Encounter: Payer: BC Managed Care – PPO | Admitting: Family Medicine

## 2023-03-06 ENCOUNTER — Encounter: Payer: BC Managed Care – PPO | Admitting: Obstetrics and Gynecology

## 2023-03-07 ENCOUNTER — Ambulatory Visit (INDEPENDENT_AMBULATORY_CARE_PROVIDER_SITE_OTHER): Payer: BC Managed Care – PPO | Admitting: Obstetrics and Gynecology

## 2023-03-07 VITALS — BP 129/75 | HR 85 | Wt 202.4 lb

## 2023-03-07 DIAGNOSIS — Z3A33 33 weeks gestation of pregnancy: Secondary | ICD-10-CM

## 2023-03-07 DIAGNOSIS — O98211 Gonorrhea complicating pregnancy, first trimester: Secondary | ICD-10-CM

## 2023-03-07 DIAGNOSIS — Z8759 Personal history of other complications of pregnancy, childbirth and the puerperium: Secondary | ICD-10-CM

## 2023-03-07 DIAGNOSIS — O99013 Anemia complicating pregnancy, third trimester: Secondary | ICD-10-CM

## 2023-03-07 DIAGNOSIS — A749 Chlamydial infection, unspecified: Secondary | ICD-10-CM

## 2023-03-07 DIAGNOSIS — O09899 Supervision of other high risk pregnancies, unspecified trimester: Secondary | ICD-10-CM

## 2023-03-07 DIAGNOSIS — O98811 Other maternal infectious and parasitic diseases complicating pregnancy, first trimester: Secondary | ICD-10-CM

## 2023-03-07 DIAGNOSIS — O021 Missed abortion: Secondary | ICD-10-CM

## 2023-03-07 DIAGNOSIS — O0990 Supervision of high risk pregnancy, unspecified, unspecified trimester: Secondary | ICD-10-CM

## 2023-03-07 NOTE — Progress Notes (Signed)
[id number] irregular contractions the other day

## 2023-03-07 NOTE — Progress Notes (Signed)
PRENATAL VISIT NOTE  Subjective:  Amy Singh is a 22 y.o. G3P1011 at [redacted]w[redacted]d being seen today for ongoing prenatal care.  She is currently monitored for the following issues for this high-risk pregnancy and has History of marijuana use; IUFD at less than 20 weeks of gestation; Gonorrhea affecting pregnancy in first trimester; Anemia affecting pregnancy in third trimester; History of severe pre-eclampsia; Short interval between pregnancies affecting pregnancy, antepartum; Supervision of high risk pregnancy, antepartum; and Chlamydia infection affecting pregnancy in first trimester on their problem list.  Patient reports  had some irregular UCs a few days ago, none currently .  Contractions: Irregular. Vag. Bleeding: None.  Movement: Present. Denies leaking of fluid.   The following portions of the patient's history were reviewed and updated as appropriate: allergies, current medications, past family history, past medical history, past social history, past surgical history and problem list.   Objective:   Vitals:   03/07/23 0848  BP: 129/75  Pulse: 85  Weight: 202 lb 6.4 oz (91.8 kg)    Fetal Status: Fetal Heart Rate (bpm): 135 Fundal Height: 33 cm Movement: Present     General:  Alert, oriented and cooperative. Patient is in no acute distress.  Skin: Skin is warm and dry. No rash noted.   Cardiovascular: Normal heart rate noted  Respiratory: Normal respiratory effort, no problems with respiration noted  Abdomen: Soft, gravid, appropriate for gestational age.  Pain/Pressure: Absent     Pelvic: Cervical exam deferred        Extremities: Normal range of motion.  Edema: Trace  Mental Status: Normal mood and affect. Normal behavior. Normal judgment and thought content.   Assessment and Plan:  Pregnancy: G3P1011 at [redacted]w[redacted]d 1. Chlamydia infection affecting pregnancy in first trimester Toc neg  2. [redacted] weeks gestation of pregnancy Nexplanon inpatient; 3lbs weight gain since last visit and  32lbs TWG this pregnancy GBS next visit  3. Supervision of high risk pregnancy, antepartum  4. History of severe pre-eclampsia Continue low dose ASA  5. Gonorrhea affecting pregnancy in first trimester Toc neg  6. IUFD at less than 20 weeks of gestation  7. Short interval between pregnancies affecting pregnancy, antepartum  8. Anemia affecting pregnancy in third trimester S/p IV iron x 3, last dose 02/27/23 (900mg  total)    Latest Ref Rng & Units 02/05/2023    1:23 PM 10/12/2022   10:50 AM 09/15/2022    4:15 PM  CBC  WBC 3.4 - 10.8 x10E3/uL 11.2  10.5  15.5   Hemoglobin 11.1 - 15.9 g/dL 9.7  16.1  09.6   Hematocrit 34.0 - 46.6 % 30.9  37.4  39.4   Platelets 150 - 450 x10E3/uL 196  232  232    Preterm labor symptoms and general obstetric precautions including but not limited to vaginal bleeding, contractions, leaking of fluid and fetal movement were reviewed in detail with the patient. Please refer to After Visit Summary for other counseling recommendations.   Return in about 3 weeks (around 03/28/2023) for md or app, low risk ob, in person.  Future Appointments  Date Time Provider Department Center  03/16/2023  9:30 AM [facility] NURSE [facility]  03/16/2023  9:45 AM [facility] US4 [facility]  03/20/2023  3:30 PM [place] Bing, MD [facility]  03/28/2023  3:50 PM [place] Bing, MD [facility]  04/04/2023  3:50 PM Reva Bores, MD [facility]  04/11/2023  3:50 PM Anyanwu, Jethro Bastos, MD [facility]  04/18/2023  4:10 PM  Reva Bores, MD [facility]    [place] Bing, MD

## 2023-03-13 ENCOUNTER — Telehealth: Payer: Self-pay | Admitting: *Deleted

## 2023-03-13 NOTE — Telephone Encounter (Signed)
-----   Message from Alvin Critchley sent at 03/13/2023  9:05 AM EDT ----- Regarding: Medical Question Pt called in to speak to a nurse about the amount of pain she has been in for 2 days in her lower abdomen.

## 2023-03-13 NOTE — Telephone Encounter (Signed)
Called pt back, pt states she has been having some contractions off and on, and they do go away, but states she has had some consistent and off on pain from top to lower abdomen, denies any VB or LOF. Advised pt to get a bug jug of water lay down and drink and rest and see if things get better and if the pain continues to reach back out and or go to the hospital to be evaluated. Pt verbalizes and understands

## 2023-03-14 ENCOUNTER — Encounter: Payer: BC Managed Care – PPO | Admitting: Family Medicine

## 2023-03-16 ENCOUNTER — Ambulatory Visit: Payer: BC Managed Care – PPO | Attending: Obstetrics and Gynecology

## 2023-03-16 ENCOUNTER — Ambulatory Visit: Payer: BC Managed Care – PPO | Admitting: *Deleted

## 2023-03-16 VITALS — BP 126/64 | HR 61

## 2023-03-16 DIAGNOSIS — Z8759 Personal history of other complications of pregnancy, childbirth and the puerperium: Secondary | ICD-10-CM | POA: Insufficient documentation

## 2023-03-16 DIAGNOSIS — F1290 Cannabis use, unspecified, uncomplicated: Secondary | ICD-10-CM | POA: Insufficient documentation

## 2023-03-16 DIAGNOSIS — O99320 Drug use complicating pregnancy, unspecified trimester: Secondary | ICD-10-CM | POA: Insufficient documentation

## 2023-03-16 DIAGNOSIS — Z3A34 34 weeks gestation of pregnancy: Secondary | ICD-10-CM

## 2023-03-16 DIAGNOSIS — O09899 Supervision of other high risk pregnancies, unspecified trimester: Secondary | ICD-10-CM | POA: Diagnosis not present

## 2023-03-16 DIAGNOSIS — O99323 Drug use complicating pregnancy, third trimester: Secondary | ICD-10-CM

## 2023-03-16 DIAGNOSIS — O0990 Supervision of high risk pregnancy, unspecified, unspecified trimester: Secondary | ICD-10-CM | POA: Insufficient documentation

## 2023-03-16 DIAGNOSIS — O09293 Supervision of pregnancy with other poor reproductive or obstetric history, third trimester: Secondary | ICD-10-CM

## 2023-03-16 DIAGNOSIS — O99213 Obesity complicating pregnancy, third trimester: Secondary | ICD-10-CM

## 2023-03-20 ENCOUNTER — Inpatient Hospital Stay (HOSPITAL_COMMUNITY)
Admission: AD | Admit: 2023-03-20 | Discharge: 2023-03-21 | Disposition: A | Discharge status: Home / Self Care | Payer: BC Managed Care – PPO | Attending: Obstetrics and Gynecology | Admitting: Obstetrics and Gynecology

## 2023-03-20 ENCOUNTER — Encounter: Payer: BC Managed Care – PPO | Admitting: Obstetrics and Gynecology

## 2023-03-20 ENCOUNTER — Encounter (HOSPITAL_COMMUNITY): Payer: Self-pay | Admitting: Obstetrics and Gynecology

## 2023-03-20 DIAGNOSIS — O26893 Other specified pregnancy related conditions, third trimester: Secondary | ICD-10-CM | POA: Insufficient documentation

## 2023-03-20 DIAGNOSIS — Z5329 Procedure and treatment not carried out because of patient's decision for other reasons: Secondary | ICD-10-CM | POA: Insufficient documentation

## 2023-03-20 DIAGNOSIS — O163 Unspecified maternal hypertension, third trimester: Secondary | ICD-10-CM

## 2023-03-20 DIAGNOSIS — O4703 False labor before 37 completed weeks of gestation, third trimester: Secondary | ICD-10-CM | POA: Insufficient documentation

## 2023-03-20 DIAGNOSIS — R1030 Lower abdominal pain, unspecified: Secondary | ICD-10-CM | POA: Diagnosis not present

## 2023-03-20 DIAGNOSIS — Z7982 Long term (current) use of aspirin: Secondary | ICD-10-CM | POA: Insufficient documentation

## 2023-03-20 DIAGNOSIS — R030 Elevated blood-pressure reading, without diagnosis of hypertension: Secondary | ICD-10-CM | POA: Insufficient documentation

## 2023-03-20 DIAGNOSIS — Z3A35 35 weeks gestation of pregnancy: Secondary | ICD-10-CM | POA: Insufficient documentation

## 2023-03-20 DIAGNOSIS — O479 False labor, unspecified: Secondary | ICD-10-CM | POA: Diagnosis not present

## 2023-03-20 DIAGNOSIS — O09293 Supervision of pregnancy with other poor reproductive or obstetric history, third trimester: Secondary | ICD-10-CM | POA: Insufficient documentation

## 2023-03-20 DIAGNOSIS — O4700 False labor before 37 completed weeks of gestation, unspecified trimester: Secondary | ICD-10-CM

## 2023-03-20 DIAGNOSIS — R1084 Generalized abdominal pain: Secondary | ICD-10-CM | POA: Diagnosis not present

## 2023-03-20 NOTE — MAU Note (Addendum)
Arrived via EMS c/o of Lower abd pain since 4pm and back pain. Positive FM, denies bleeding or leaking of fluid. Uncomplicated pregnancy according to pt.

## 2023-03-20 NOTE — MAU Provider Note (Signed)
History    [id number]  Arrival date and time: 03/20/23 2230   Chief Complaint  Patient presents with   Abdominal Pain   Back Pain   HPI Amy Singh is a 22 y.o. at [redacted]w[redacted]d by 10 week [place], who presents for back pain and lower abdominal pain since about 4pm today. She states that pain in intermittent and feels some tightening. Intermittent nausea, improved with phenergan. No vomiting. No prior history of gall stones, she does have a history of severe pre-eclampsia in previous pregnancy, and is on daily aspirin, also has had a history of pre-viable IUFD. She has not had high blood pressures in this pregnancy until today. No h/aches, visual changes, RUQ pain. No numbness, tingling or weakness in b/l lower extremities. No no urinary urgency or dysuria, no abnormal vaginal discharge. Pain has improved at the time of my evaluation.  Vaginal bleeding: No LOF: No Fetal Movement: Yes Contractions: Yes, as above.  --/--/A POS (07/06 0532)  OB History     Gravida  3   Para  2   Term  2   Preterm      AB  1   Living  2      SAB  1   IAB      Ectopic      Multiple  0   Live Births  2           Past Medical History:  Diagnosis Date   Alpha thalassemia silent carrier 03/24/2021   On horizon 4   Chlamydia infection affecting pregnancy 03/16/2021   neg toc 04/2021   Gonorrhea affecting pregnancy 03/16/2021   [x ] toc late July: neg toc    History of chlamydia    History of hyperemesis gravidarum 10/20/2020   26lb weight loss in first pregnanc   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   History of molluscum contagiosum    IUFD at less than 20 weeks of gestation 11/18/2020   Neg lupus anti-coag, anti-phospholipid antibodies, anti-cardiolipin, anti-beta2 glycoprotein    Severe pre-eclampsia 09/21/2021   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   Qualifier: Diagnosis of  By: Barbaraann Barthel MD, [place]      Past Surgical History:  Procedure Laterality Date   WISDOM TOOTH EXTRACTION       Family History  Problem Relation Age of Onset   Hypertension Paternal Grandmother    Diabetes Paternal Grandmother     Social History   Socioeconomic History   Marital status: Single    Spouse name: Not on file   Number of children: Not on file   Years of education: Not on file   Highest education level: Not on file  Occupational History   Not on file  Tobacco Use   Smoking status: Never   Smokeless tobacco: Never  Vaping Use   Vaping Use: Never used  Substance and Sexual Activity   Alcohol use: No   Drug use: Yes    Types: Marijuana    Comment: Last smoked 03/26/2023   Sexual activity: Not Currently    Partners: Male    Comment: Pregnant   Other Topics Concern   Not on file  Social History Narrative   Not on file   Social Determinants of Health   Financial Resource Strain: Not on file  Food Insecurity: No Food Insecurity (03/30/2023)   Hunger Vital Sign    Worried About Running Out of Food in the Last Year: Never true    Ran Out of Food  in the Last Year: Never true  Transportation Needs: No Transportation Needs (03/30/2023)   PRAPARE - Administrator, Civil Service (Medical): No    Lack of Transportation (Non-Medical): No  Physical Activity: Not on file  Stress: Not on file  Social Connections: Not on file  Intimate Partner Violence: Not At Risk (03/30/2023)   Humiliation, Afraid, Rape, and Kick questionnaire    Fear of Current or Ex-Partner: No    Emotionally Abused: No    Physically Abused: No    Sexually Abused: No    No Known Allergies  No current facility-administered medications on file prior to encounter.   Current Outpatient Medications on File Prior to Encounter  Medication Sig Dispense Refill   aspirin EC 81 MG tablet Take 1 tablet (81 mg total) by mouth daily. (Patient not taking: Reported on 11/13/2022) 60 tablet 2   ondansetron (ZOFRAN-ODT) 4 MG disintegrating tablet Take 1 tablet (4 mg total) by mouth every 6 (six) hours as needed  for nausea. (Patient not taking: Reported on 12/28/2022) 20 tablet 0   pantoprazole (PROTONIX) 40 MG tablet Take 1 tablet (40 mg total) by mouth daily. 30 tablet 1   promethazine (PHENERGAN) 25 MG tablet Take 1 tablet (25 mg total) by mouth every 6 (six) hours as needed for nausea or vomiting. 30 tablet 1   Review of Systems  Constitutional:  Negative for chills and fever.  Eyes:  Negative for blurred vision.  Cardiovascular:  Negative for leg swelling.  Gastrointestinal:  Positive for abdominal pain and nausea. Negative for vomiting.  Genitourinary:  Negative for dysuria, frequency and urgency.  Musculoskeletal:  Positive for back pain. Negative for myalgias.  Skin:  Negative for itching.  Neurological:  Negative for dizziness and weakness.   Pertinent positives and negative per HPI, all others reviewed and negative  Physical Exam   BP (!) 148/74   Pulse 75   Temp 98.9 F (37.2 C) (Oral)   Resp 17   LMP 07/25/2022 (Approximate)   SpO2 100%   No data found.   Physical Exam Vitals reviewed.  Constitutional:      General: She is not in acute distress.    Appearance: She is well-developed. She is not toxic-appearing.  HENT:     Head: Normocephalic and atraumatic.     Mouth/Throat:     Mouth: Mucous membranes are moist.  Eyes:     Extraocular Movements: Extraocular movements intact.  Cardiovascular:     Rate and Rhythm: Normal rate.  Pulmonary:     Effort: Pulmonary effort is normal. No respiratory distress.     Breath sounds: Normal breath sounds.  Abdominal:     Palpations: Abdomen is soft.     Tenderness: There is no abdominal tenderness. There is no right CVA tenderness or left CVA tenderness.     Comments: Gravid  Skin:    General: Skin is warm and dry.  Neurological:     Mental Status: She is alert and oriented to person, place, and time.  Psychiatric:        Mood and Affect: Mood normal.        Behavior: Behavior normal.     Cervical Exam Dilation:  1 Effacement (%): 50 Cervical Position: Posterior Station: -3 Exam by:: Khayree Delellis, MD  FHT Baseline: 150 bpm Variability: moderate Accelerations: >2 15 X 15 accels Decelerations: Absent Uterine activity: no obvious uterine contractions noted, some uterine irritabilty  Reactive NST.  Labs No results found for this or  any previous visit (from the past 24 hour(s)).  Imaging No results found.  MAU Course  Procedures Lab Orders  No laboratory test(s) ordered today   No orders of the defined types were placed in this encounter.  Imaging Orders  No imaging studies ordered today   MDM Moderate (Level 3-4)  Assessment and Plan  #Preterm contractions - Plan: Discharge patient Discharge disposition: 01-Home or Self Care; Discharge patient date: 03/21/2023, Diet - low sodium heart healthy, Activity as tolerated - No restrictions, Call MD for:  temperature >100.4, Call MD for:  persistant nausea and vomiting, Call MD for:  severe uncontrolled pain, Call MD for:, Call MD for:  difficulty breathing, headache or visual disturbances, Discharge patient Discharge disposition: 01-Home or Self Care; Discharge patient date: 03/21/2023  [redacted] weeks gestation of pregnancy  Elevated blood pressure affecting pregnancy in third trimester, antepartum  21 y.o. G3P1011 at [redacted]w[redacted]d with intermittent low back pain and lower abdominal pain. Exam findings unremarkable. Pain resolved during my evaluation. No contractions noted on toco. Suspect preterm contractions, but no evidence of preterm labor at this time.  Elevated blood pressures noted during MAU visit, discussed getting labs to evaluate for pre-eclampsia, as well as UA. Patient declines. States she feels good and will like to be discharged home.  Return precautions discussed, need for close follow up in prenatal care, as well as pre-eclampsia precautions discussed.   #FWB NST: Reactive   Dispo: discharged to home in stable condition   Discharge  Instructions     Activity as tolerated - No restrictions   Complete by: As directed    Call MD for:   Complete by: As directed    Vaginal bleeding, leakage of fluid (like your water broke), decreased baby movements   Call MD for:  difficulty breathing, headache or visual disturbances   Complete by: As directed    Call MD for:  persistant nausea and vomiting   Complete by: As directed    Call MD for:  severe uncontrolled pain   Complete by: As directed    Call MD for:  temperature >100.4   Complete by: As directed    Diet - low sodium heart healthy   Complete by: As directed        Allergies as of 03/21/2023   No Known Allergies      Medication List     ASK your doctor about these medications    aspirin EC 81 MG tablet Take 1 tablet (81 mg total) by mouth daily.   ondansetron 4 MG disintegrating tablet Commonly known as: ZOFRAN-ODT Take 1 tablet (4 mg total) by mouth every 6 (six) hours as needed for nausea.   pantoprazole 40 MG tablet Commonly known as: Protonix Take 1 tablet (40 mg total) by mouth daily.   promethazine 25 MG tablet Commonly known as: PHENERGAN Take 1 tablet (25 mg total) by mouth every 6 (six) hours as needed for nausea or vomiting.       Amy Evens MD MPH OB Fellow, Faculty Practice [facility], [facility] 04/02/2023

## 2023-03-21 DIAGNOSIS — Z7982 Long term (current) use of aspirin: Secondary | ICD-10-CM | POA: Diagnosis not present

## 2023-03-21 DIAGNOSIS — O4703 False labor before 37 completed weeks of gestation, third trimester: Secondary | ICD-10-CM

## 2023-03-21 DIAGNOSIS — Z5329 Procedure and treatment not carried out because of patient's decision for other reasons: Secondary | ICD-10-CM | POA: Diagnosis not present

## 2023-03-21 DIAGNOSIS — O26893 Other specified pregnancy related conditions, third trimester: Secondary | ICD-10-CM | POA: Diagnosis not present

## 2023-03-21 DIAGNOSIS — Z3A35 35 weeks gestation of pregnancy: Secondary | ICD-10-CM | POA: Diagnosis not present

## 2023-03-21 DIAGNOSIS — O09293 Supervision of pregnancy with other poor reproductive or obstetric history, third trimester: Secondary | ICD-10-CM | POA: Diagnosis not present

## 2023-03-21 DIAGNOSIS — R030 Elevated blood-pressure reading, without diagnosis of hypertension: Secondary | ICD-10-CM | POA: Diagnosis not present

## 2023-03-21 NOTE — Progress Notes (Signed)
   03/21/23 0024  Vital Signs  BP (!) 148/74  Pulse Rate 75  Pain Assessment  Pain Assessment No/denies pain  Pain Score 0   Pt''s BP as above. Pt has Hx of severe Pre E. Denies Pre E symptoms and does not want to stay for labs. Dr. Ladon Applebaum aware. Pt left in good condition.

## 2023-03-23 ENCOUNTER — Inpatient Hospital Stay (HOSPITAL_COMMUNITY)
Admission: AD | Admit: 2023-03-23 | Discharge: 2023-03-23 | Discharge status: Against Medical Advice | Payer: BC Managed Care – PPO | Attending: Obstetrics and Gynecology | Admitting: Obstetrics and Gynecology

## 2023-03-23 ENCOUNTER — Other Ambulatory Visit: Payer: Self-pay

## 2023-03-23 ENCOUNTER — Encounter (HOSPITAL_COMMUNITY): Payer: Self-pay | Admitting: Obstetrics and Gynecology

## 2023-03-23 DIAGNOSIS — O1403 Mild to moderate pre-eclampsia, third trimester: Secondary | ICD-10-CM | POA: Insufficient documentation

## 2023-03-23 DIAGNOSIS — O219 Vomiting of pregnancy, unspecified: Secondary | ICD-10-CM

## 2023-03-23 DIAGNOSIS — O99283 Endocrine, nutritional and metabolic diseases complicating pregnancy, third trimester: Secondary | ICD-10-CM | POA: Diagnosis not present

## 2023-03-23 DIAGNOSIS — E876 Hypokalemia: Secondary | ICD-10-CM

## 2023-03-23 DIAGNOSIS — R112 Nausea with vomiting, unspecified: Secondary | ICD-10-CM | POA: Diagnosis not present

## 2023-03-23 DIAGNOSIS — O212 Late vomiting of pregnancy: Secondary | ICD-10-CM | POA: Insufficient documentation

## 2023-03-23 DIAGNOSIS — Z3A35 35 weeks gestation of pregnancy: Secondary | ICD-10-CM

## 2023-03-23 LAB — URINALYSIS, ROUTINE W REFLEX MICROSCOPIC
Bilirubin Urine: NEGATIVE
Glucose, UA: NEGATIVE mg/dL
Ketones, ur: 80 mg/dL — AB
Leukocytes,Ua: NEGATIVE
Nitrite: NEGATIVE
Protein, ur: 100 mg/dL — AB
Specific Gravity, Urine: 1.017 (ref 1.005–1.030)
pH: 5 (ref 5.0–8.0)

## 2023-03-23 LAB — COMPREHENSIVE METABOLIC PANEL
ALT: 14 U/L (ref 0–44)
AST: 17 U/L (ref 15–41)
Albumin: 3.3 g/dL — ABNORMAL LOW (ref 3.5–5.0)
Alkaline Phosphatase: 112 U/L (ref 38–126)
Anion gap: 16 — ABNORMAL HIGH (ref 5–15)
BUN: <5 mg/dL — ABNORMAL LOW (ref 6–20)
CO2: 19 mmol/L — ABNORMAL LOW (ref 22–32)
Calcium: 9 mg/dL (ref 8.9–10.3)
Chloride: 103 mmol/L (ref 98–111)
Creatinine, Ser: 0.64 mg/dL (ref 0.44–1.00)
GFR, Estimated: >60 mL/min (ref >60)
Glucose, Bld: 86 mg/dL (ref 70–99)
Potassium: 3 mmol/L — ABNORMAL LOW (ref 3.5–5.1)
Sodium: 138 mmol/L (ref 135–145)
Total Bilirubin: 2.2 mg/dL — ABNORMAL HIGH (ref 0.3–1.2)
Total Protein: 6.7 g/dL (ref 6.5–8.1)

## 2023-03-23 LAB — CBC
HCT: 37 % (ref 36.0–46.0)
Hemoglobin: 11.8 g/dL — ABNORMAL LOW (ref 12.0–15.0)
MCH: 27.7 pg (ref 26.0–34.0)
MCHC: 31.9 g/dL (ref 30.0–36.0)
MCV: 86.9 fL (ref 80.0–100.0)
Platelets: 197 10*3/uL (ref 150–400)
RBC: 4.26 MIL/uL (ref 3.87–5.11)
RDW: 17.2 % — ABNORMAL HIGH (ref 11.5–15.5)
WBC: 14 10*3/uL — ABNORMAL HIGH (ref 4.0–10.5)
nRBC: 0 % (ref 0.0–0.2)

## 2023-03-23 LAB — PROTEIN / CREATININE RATIO, URINE
Creatinine, Urine: 113 mg/dL
Protein Creatinine Ratio: 0.52 mg/mg{Cre} — ABNORMAL HIGH (ref 0.00–0.15)
Total Protein, Urine: 59 mg/dL

## 2023-03-23 LAB — MAGNESIUM: Magnesium: 1.8 mg/dL (ref 1.7–2.4)

## 2023-03-23 MED ORDER — POTASSIUM CHLORIDE CRYS ER 20 MEQ PO TBCR
40.0000 meq | EXTENDED_RELEASE_TABLET | Freq: Once | ORAL | Status: DC
  Filled 2023-03-23: qty 2

## 2023-03-23 MED ORDER — LACTATED RINGERS IV BOLUS
1000.0000 mL | Freq: Once | INTRAVENOUS | Status: AC
  Administered 2023-03-23: 1000 mL via INTRAVENOUS

## 2023-03-23 MED ORDER — ONDANSETRON 4 MG PO TBDP: 4.0000 mg | ORAL_TABLET | Freq: Once | ORAL | Status: DC

## 2023-03-23 MED ORDER — POTASSIUM CHLORIDE 10 MEQ/100ML IV SOLN
10.0000 meq | INTRAVENOUS | Status: DC
  Administered 2023-03-23 (×2): 10 meq via INTRAVENOUS
  Filled 2023-03-23 (×3): qty 100

## 2023-03-23 MED ORDER — ONDANSETRON HCL 4 MG/2ML IJ SOLN
4.0000 mg | Freq: Once | INTRAMUSCULAR | Status: AC
  Administered 2023-03-23: 4 mg via INTRAVENOUS
  Filled 2023-03-23: qty 2

## 2023-03-23 MED ORDER — METOCLOPRAMIDE HCL 10 MG PO TABS
10.0000 mg | ORAL_TABLET | Freq: Once | ORAL | Status: AC
  Administered 2023-03-23: 10 mg via ORAL
  Filled 2023-03-23: qty 1

## 2023-03-23 MED ORDER — LACTATED RINGERS IV SOLN: Freq: Once | INTRAVENOUS | Status: AC

## 2023-03-23 NOTE — MAU Note (Signed)
Patient called out requesting discharge papers. CNM made aware. This RN went into the room and explained that the patient still had two bags of potassium to complete via her IV and if she is choosing to leave it would be AMA. Form provided. Patient read and signed.   IV removed. CNM made aware.

## 2023-03-23 NOTE — MAU Provider Note (Cosign Needed Addendum)
Chief Complaint:  Emesis   Event Date/Time   First Provider Initiated Contact with Patient 03/23/23 [phone]     HPI: Amy Singh is a 22 y.o. G3P1011 at 20w6dwho presents to maternity admissions reporting nausea and vomiting for 2 days.  Took Phenergan yesterday but none since.  Repeatedly asks for water to drink (I authorized [employer]), states does not care if she vomits it up. She reports good fetal movement, denies LOF, vaginal bleeding, urinary symptoms, h/a, dizziness, diarrhea, constipation or fever/chills.  She denies headache, visual changes or RUQ abdominal pain.  Her history is remarkable for severe preeclampsia and IUFD.   She admits to smoking marijuana 2 hours ago.  Was seen here 6/26 and BP was elevated.  Refused to have blood drawn and left AMA.  Emesis  This is a new problem. The current episode started yesterday. The problem has been unchanged. There has been no fever. Pertinent negatives include no abdominal pain, chills, diarrhea, fever or headaches. Treatments tried: phenergan yesterday. The treatment provided no relief.   RN Note: Amy Singh is a 22 y.o. at [redacted]w[redacted]d here in MAU reporting: was here the other day for pain and ever since she left she has been throwing up. States she has been taking her promethazine but it has not helped - last dose was yesterday. Denies pain, VB, LOF, or DFM.   Onset of complaint: 2 days                 Pain score: 0  Past Medical History: Past Medical History:  Diagnosis Date   Alpha thalassemia silent carrier 03/24/2021   On horizon 4   Chlamydia infection affecting pregnancy 03/16/2021   neg toc 04/2021   Gonorrhea affecting pregnancy 03/16/2021   [x ] toc late July: neg toc    History of chlamydia    History of hyperemesis gravidarum 10/20/2020   26lb weight loss in first pregnanc   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   History of molluscum contagiosum    IUFD at less than 20 weeks of gestation 11/18/2020   Neg lupus  anti-coag, anti-phospholipid antibodies, anti-cardiolipin, anti-beta2 glycoprotein    Severe pre-eclampsia 09/21/2021   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   Qualifier: Diagnosis of  By: Barbaraann Barthel MD, [place]      Past obstetric history: OB History  Gravida Para Term Preterm AB Living  3 1 1   1 1   SAB IAB Ectopic Multiple Live Births  1     0 1    # Outcome Date GA Lbr Len/2nd Weight Sex Delivery Anes PTL Lv  3 Current           2 Term 09/21/21 [redacted]w[redacted]d 19:55 / 00:07 3320 g F Vag-Spont None  LIV  1 SAB 11/19/20 [redacted]w[redacted]d            Complications: IUFD at less than 20 weeks of gestation    Past Surgical History: Past Surgical History:  Procedure Laterality Date   WISDOM TOOTH EXTRACTION      Family History: Family History  Problem Relation Age of Onset   Hypertension Paternal Grandmother    Diabetes Paternal Grandmother     Social History: Social History   Tobacco Use   Smoking status: Never   Smokeless tobacco: Never  Vaping Use   Vaping Use: Never used  Substance Use Topics   Alcohol use: No   Drug use: Yes    Types: Marijuana    Comment: 03/23/23; pt  reports use throughout G3    Allergies: No Known Allergies  Meds:  No medications prior to admission.    I have reviewed patient's Past Medical Hx, Surgical Hx, Family Hx, Social Hx, medications and allergies.   ROS:  Review of Systems  Constitutional:  Negative for chills and fever.  Gastrointestinal:  Positive for vomiting. Negative for abdominal pain and diarrhea.  Neurological:  Negative for headaches.   Other systems negative  Physical Exam  Patient Vitals for the past 24 hrs:  BP Temp Temp src Pulse Resp SpO2 Height Weight  03/23/23 1017 139/76 -- -- 69 -- -- -- --  03/23/23 1001 119/65 -- -- 61 -- -- -- --  03/23/23 0953 129/60 -- -- 65 -- -- -- --  03/23/23 0902 (!) 136/50 -- -- (!) 59 -- -- -- --  03/23/23 0847 129/69 -- -- 88 -- -- -- --  03/23/23 0817 136/74 -- -- 75 -- -- -- --  03/23/23 0802 139/63 --  -- 64 -- -- -- --  03/23/23 0747 (!) 147/67 -- -- (!) 56 -- -- -- --  03/23/23 0732 (!) 140/69 -- -- (!) 59 -- -- -- --  03/23/23 0706 -- -- -- -- -- 100 % -- --  03/23/23 0701 -- -- -- -- -- 99 % -- --  03/23/23 0656 -- -- -- -- -- 100 % -- --  03/23/23 0651 -- -- -- -- -- 99 % -- --  03/23/23 0648 (!) 139/93 -- -- 94 -- -- -- --  03/23/23 0646 -- -- -- -- -- 99 % -- --  03/23/23 0641 -- -- -- -- -- 100 % -- --  03/23/23 0638 (!) 152/79 -- -- 91 -- 99 % -- --  03/23/23 0637 (!) 152/79 -- -- 91 -- 99 % -- --  03/23/23 0617 (!) 151/80 98.1 F (36.7 C) Oral (!) 111 16 -- 5\' 2"  (1.575 m) 89.2 kg   Constitutional: Well-developed, well-nourished female in no acute distress. Continually asks for water, even when I explain why I don't recommend it.  Refuses to eat ice chips and asks over and over for water to drink   Will not lie down. Appears somnolent but does answer questions.  Cardiovascular: normal rate and rhythm Respiratory: normal effort GI: Abd soft, non-tender, gravid appropriate for gestational age.   No rebound or guarding. MS: Extremities nontender, Trace to 1+ edema, normal ROM Neurologic: Alert and oriented x 4. DTS 3+ GU: Neg CVAT.  FHT:  Baseline 140 , moderate variability, accelerations present, no decelerations Contractions: Occasional    Labs: Results for orders placed or performed during the hospital encounter of 03/23/23 (from the past 24 hour(s))  CBC     Status: Abnormal   Collection Time: 03/23/23  6:53 AM  Result Value Ref Range   WBC 14.0 (H) 4.0 - 10.5 K/uL   RBC 4.26 3.87 - 5.11 MIL/uL   Hemoglobin 11.8 (L) 12.0 - 15.0 g/dL   HCT 16.1 09.6 - 04.5 %   MCV 86.9 80.0 - 100.0 fL   MCH 27.7 26.0 - 34.0 pg   MCHC 31.9 30.0 - 36.0 g/dL   RDW 40.9 (H) 81.1 - 91.4 %   Platelets 197 150 - 400 K/uL   nRBC 0.0 0.0 - 0.2 %  Comprehensive metabolic panel     Status: Abnormal   Collection Time: 03/23/23  6:53 AM  Result Value Ref Range   Sodium 138 135 - 145 mmol/L  Potassium 3.0 (L) 3.5 - 5.1 mmol/L   Chloride 103 98 - 111 mmol/L   CO2 19 (L) 22 - 32 mmol/L   Glucose, Bld 86 70 - 99 mg/dL   BUN <5 (L) 6 - 20 mg/dL   Creatinine, Ser 1.61 0.44 - 1.00 mg/dL   Calcium 9.0 8.9 - 09.6 mg/dL   Total Protein 6.7 6.5 - 8.1 g/dL   Albumin 3.3 (L) 3.5 - 5.0 g/dL   AST 17 15 - 41 U/L   ALT 14 0 - 44 U/L   Alkaline Phosphatase 112 38 - 126 U/L   Total Bilirubin 2.2 (H) 0.3 - 1.2 mg/dL   GFR, Estimated >04 >54 mL/min   Anion gap 16 (H) 5 - 15  Urinalysis, Routine w reflex microscopic -Urine, Clean Catch     Status: Abnormal   Collection Time: 03/23/23  7:20 AM  Result Value Ref Range   Color, Urine YELLOW YELLOW   APPearance HAZY (A) CLEAR   Specific Gravity, Urine 1.017 1.005 - 1.030   pH 5.0 5.0 - 8.0   Glucose, UA NEGATIVE NEGATIVE mg/dL   Hgb urine dipstick SMALL (A) NEGATIVE   Bilirubin Urine NEGATIVE NEGATIVE   Ketones, ur 80 (A) NEGATIVE mg/dL   Protein, ur 098 (A) NEGATIVE mg/dL   Nitrite NEGATIVE NEGATIVE   Leukocytes,Ua NEGATIVE NEGATIVE   RBC / HPF 11-20 0 - 5 RBC/hpf   WBC, UA 6-10 0 - 5 WBC/hpf   Bacteria, UA RARE (A) NONE SEEN   Squamous Epithelial / HPF 11-20 0 - 5 /HPF   Mucus PRESENT   Protein / creatinine ratio, urine     Status: Abnormal   Collection Time: 03/23/23  7:20 AM  Result Value Ref Range   Creatinine, Urine 113 mg/dL   Total Protein, Urine 59 mg/dL   Protein Creatinine Ratio 0.52 (H) 0.00 - 0.15 mg/mg[Cre]    A/Positive/-- (01/18 1050)  Imaging:    MAU Course/MDM: I have reviewed the triage vital signs and the nursing notes.   Pertinent labs & imaging results that were available during my care of the patient were reviewed by me and considered in my medical decision making (see chart for details).      I have reviewed her medical records including past results, notes and treatments.   I have ordered labs and reviewed results.  NST reviewed  Treatments in MAU included IV hydration, zofran .     Assessment: Single IUP at [redacted]w[redacted]d Gestational Hypertension, r/o preeclampsia Nausea and vomiting Recent Marijuana use  Plan: Care turned over to oncoming provider  Wynelle Bourgeois CNM, MSN Certified Nurse-Midwife 03/23/2023   Assumed care of patient at 0800. UA with ketonuria present. Some blood and bacteria also noted so will add on culture. CBC stable. CMP with a mild hypokalemia but otherwise unremarkable. UPCR elevated at 0.52. BPs have been normotensive to mild range. Patient is asymptomatic. Patient meets criteria for pre-eclampsia without severe features.   I went in to discuss results with patient. We discussed benefits of replenishing potassium which patient is agreeable to. Initially ordered PO potassium however patient was unable to tolerate PO despite antiemetics. IV potassium ordered. Magnesium level added.   62: RN reported to CNM that patient pulled IV out. IV restarted   1100: RN notified CNM that patient signed AMA form. Patient left prior to CNM getting to bedside. Patient received 2 potassium runs.   Assessment/Plan:  1. [redacted] weeks gestation of pregnancy   2. Mild pre-eclampsia in  third trimester   3. Nausea and vomiting during pregnancy   4. Hypokalemia    - Patient left AMA - Will send message to [facility] to get patient in for BP check - Attempted to call patient to discuss BPs and labs at length again and discuss warning signs of severe pre-e. Voicemail was left. Will send message on MyChart as well.    Brand Males, CNM 03/23/23, 11:24 AM

## 2023-03-23 NOTE — MAU Note (Signed)
.  Amy Singh is a 22 y.o. at [redacted]w[redacted]d here in MAU reporting: was here the other day for pain and ever since she left she has been throwing up. States she has been taking her promethazine but it has not helped - last dose was yesterday. Denies pain, VB, LOF, or DFM.   Onset of complaint: 2 days Pain score: 0 Vitals:   03/23/23 0617  BP: (!) 151/80  Pulse: (!) 111  Resp: 16  Temp: 98.1 F (36.7 C)     FHT:150 Lab orders placed from triage:  UA

## 2023-03-24 ENCOUNTER — Encounter (HOSPITAL_COMMUNITY): Payer: Self-pay | Admitting: Obstetrics & Gynecology

## 2023-03-24 ENCOUNTER — Inpatient Hospital Stay (HOSPITAL_COMMUNITY)
Admission: AD | Admit: 2023-03-24 | Discharge: 2023-03-24 | Disposition: A | Discharge status: Home / Self Care | Payer: BC Managed Care – PPO | Attending: Obstetrics & Gynecology | Admitting: Obstetrics & Gynecology

## 2023-03-24 DIAGNOSIS — Z3A36 36 weeks gestation of pregnancy: Secondary | ICD-10-CM | POA: Insufficient documentation

## 2023-03-24 DIAGNOSIS — O212 Late vomiting of pregnancy: Secondary | ICD-10-CM | POA: Diagnosis not present

## 2023-03-24 DIAGNOSIS — O0990 Supervision of high risk pregnancy, unspecified, unspecified trimester: Secondary | ICD-10-CM

## 2023-03-24 DIAGNOSIS — O26893 Other specified pregnancy related conditions, third trimester: Secondary | ICD-10-CM | POA: Diagnosis not present

## 2023-03-24 LAB — COMPREHENSIVE METABOLIC PANEL
ALT: 14 U/L (ref 0–44)
AST: 19 U/L (ref 15–41)
Albumin: 3.3 g/dL — ABNORMAL LOW (ref 3.5–5.0)
Alkaline Phosphatase: 109 U/L (ref 38–126)
Anion gap: 19 — ABNORMAL HIGH (ref 5–15)
BUN: <5 mg/dL — ABNORMAL LOW (ref 6–20)
CO2: 17 mmol/L — ABNORMAL LOW (ref 22–32)
Calcium: 8.9 mg/dL (ref 8.9–10.3)
Chloride: 102 mmol/L (ref 98–111)
Creatinine, Ser: 0.63 mg/dL (ref 0.44–1.00)
GFR, Estimated: >60 mL/min (ref >60)
Glucose, Bld: 74 mg/dL (ref 70–99)
Potassium: 2.9 mmol/L — ABNORMAL LOW (ref 3.5–5.1)
Sodium: 138 mmol/L (ref 135–145)
Total Bilirubin: 2.5 mg/dL — ABNORMAL HIGH (ref 0.3–1.2)
Total Protein: 6.6 g/dL (ref 6.5–8.1)

## 2023-03-24 LAB — URINALYSIS, ROUTINE W REFLEX MICROSCOPIC
Bilirubin Urine: NEGATIVE
Glucose, UA: NEGATIVE mg/dL
Ketones, ur: 80 mg/dL — AB
Leukocytes,Ua: NEGATIVE
Nitrite: NEGATIVE
Protein, ur: 100 mg/dL — AB
Specific Gravity, Urine: 1.02 (ref 1.005–1.030)
pH: 6 (ref 5.0–8.0)

## 2023-03-24 LAB — RAPID URINE DRUG SCREEN, HOSP PERFORMED
Amphetamines: NOT DETECTED
Barbiturates: NOT DETECTED
Benzodiazepines: NOT DETECTED
Cocaine: NOT DETECTED
Opiates: NOT DETECTED
Tetrahydrocannabinol: POSITIVE — AB

## 2023-03-24 LAB — CULTURE, OB URINE: Culture: <10000 — AB

## 2023-03-24 MED ORDER — SODIUM CHLORIDE 0.9 % IV SOLN
25.0000 mg | Freq: Once | INTRAVENOUS | Status: AC
  Administered 2023-03-24: 25 mg via INTRAVENOUS
  Filled 2023-03-24: qty 1

## 2023-03-24 MED ORDER — LACTATED RINGERS IV BOLUS
1000.0000 mL | Freq: Once | INTRAVENOUS | Status: AC
  Administered 2023-03-24: 1000 mL via INTRAVENOUS

## 2023-03-24 NOTE — MAU Note (Signed)
Pt. Came to nursing desk hoping to be discharged in order to make it to her baby shower today. RN notified pt that team was discussing POC d/t potassium levels. Pt. Voiced she would not be able to stay for potassium replacement d/t prior engagement. CNM notified. AMA paperwork signed.

## 2023-03-24 NOTE — MAU Note (Addendum)
Pt vomiting - doesn't know when started- sometime yesterday  [facility] [place]  Denies HSV No UC's

## 2023-03-24 NOTE — MAU Provider Note (Cosign Needed Addendum)
History     CSN: [id number]  Arrival date and time: 03/24/23 6578   Event Date/Time   First Provider Initiated Contact with Patient 03/24/23 0552     Amy Singh is a 22 y.o. G3P1011 at [redacted]w[redacted]d who receives care at [facility].  She presents today for nausea and vomiting.  Patient was seen yesterday for same issue, but left AMA prior to completion of treatment. She reports having one run of Potassium.  Patient states she also received phenergan and zofran during that time, but has had no other medications.  She states she is unsure of how many times she has vomited today.  She states she has been unable to eat, but has been drinking water although she can't keep it down. She denies vaginal concerns and endorses fetal movement.    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   History of molluscum contagiosum    IUFD at less than 20 weeks of gestation 11/18/2020   Neg lupus anti-coag, anti-phospholipid antibodies, anti-cardiolipin, anti-beta2 glycoprotein    Severe pre-eclampsia 09/21/2021   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   Qualifier: Diagnosis of  By: Barbaraann Barthel MD, [place]      Past Surgical History:  Procedure Laterality Date   WISDOM TOOTH EXTRACTION      Family History  Problem Relation Age of Onset   Hypertension Paternal Grandmother    Diabetes Paternal Grandmother     Social History   Tobacco  Use   Smoking status: Never   Smokeless tobacco: Never  Vaping Use   Vaping Use: Never used  Substance Use Topics   Alcohol use: No   Drug use: Yes    Types: Marijuana    Comment: 03/23/23; pt reports use throughout G3    Allergies: No Known Allergies  Medications Prior to Admission  Medication Sig Dispense Refill Last Dose   pantoprazole (PROTONIX) 40 MG tablet Take 1 tablet (40 mg total) by mouth daily. 30 tablet 1 03/23/2023   acetaminophen (TYLENOL) 500 MG tablet Take 1,000 mg by mouth every 6 (six) hours as needed.      aspirin EC 81 MG tablet Take 1 tablet (81 mg total) by mouth daily. (Patient not taking: Reported on 11/13/2022) 60 tablet 2    ondansetron (ZOFRAN-ODT) 4 MG disintegrating tablet Take 1 tablet (4 mg total) by mouth every 6 (six) hours as needed for nausea. (Patient not taking: Reported on 12/28/2022) 20 tablet 0    promethazine (PHENERGAN) 25 MG tablet Take 1 tablet (25 mg total) by mouth every 6 (six) hours as needed for nausea or  vomiting. 30 tablet 1 none    Review of Systems  Constitutional:  Negative for chills and fever.  Gastrointestinal:  Positive for nausea and vomiting. Negative for abdominal pain.  Genitourinary:  Negative for difficulty urinating, dysuria, vaginal bleeding and vaginal discharge.   Physical Exam   Blood pressure 134/80, pulse 94, temperature 98 F (36.7 C), temperature source Oral, resp. rate 18, height 5\' 2"  (1.575 m), weight 88.5 kg, last menstrual period 07/25/2022, unknown if currently breastfeeding.  Physical Exam Vitals reviewed.  Constitutional:      Appearance: Normal appearance.  HENT:     Head: Normocephalic and atraumatic.  Eyes:     Conjunctiva/sclera: Conjunctivae normal.  Cardiovascular:     Rate and Rhythm: Normal rate.  Pulmonary:     Effort: Pulmonary effort is normal. No respiratory distress.  Abdominal:     Palpations: Abdomen is soft.  Musculoskeletal:        General: Normal range of motion.     Cervical  back: Normal range of motion.  Skin:    General: Skin is warm and dry.  Neurological:     Mental Status: She is lethargic.  Psychiatric:        Mood and Affect: Mood normal.    Fetal Assessment 140 bpm, Mod Var, -Decels, +Accels Toco: Irregular, Q1-41min  MAU Course   Results for orders placed or performed during the hospital encounter of 03/23/23 (from the past 24 hour(s))  CBC     Status: Abnormal   Collection Time: 03/23/23  6:53 AM  Result Value Ref Range   WBC 14.0 (H) 4.0 - 10.5 K/uL   RBC 4.26 3.87 - 5.11 MIL/uL   Hemoglobin 11.8 (L) 12.0 - 15.0 g/dL   HCT 16.1 09.6 - 04.5 %   MCV 86.9 80.0 - 100.0 fL   MCH 27.7 26.0 - 34.0 pg   MCHC 31.9 30.0 - 36.0 g/dL   RDW 40.9 (H) 81.1 - 91.4 %   Platelets 197 150 - 400 K/uL   nRBC 0.0 0.0 - 0.2 %  Comprehensive metabolic panel     Status: Abnormal   Collection Time: 03/23/23  6:53 AM  Result Value Ref Range   Sodium 138 135 - 145 mmol/L   Potassium 3.0 (L) 3.5 - 5.1 mmol/L   Chloride 103 98 - 111 mmol/L   CO2 19 (L) 22 - 32 mmol/L   Glucose, Bld 86 70 - 99 mg/dL   BUN <5 (L) 6 - 20 mg/dL   Creatinine, Ser 7.82 0.44 - 1.00 mg/dL   Calcium 9.0 8.9 - 95.6 mg/dL   Total Protein 6.7 6.5 - 8.1 g/dL   Albumin 3.3 (L) 3.5 - 5.0 g/dL   AST 17 15 - 41 U/L   ALT 14 0 - 44 U/L   Alkaline Phosphatase 112 38 - 126 U/L   Total Bilirubin 2.2 (H) 0.3 - 1.2 mg/dL   GFR, Estimated >21 >30 mL/min   Anion gap 16 (H) 5 - 15  Magnesium     Status: None   Collection Time: 03/23/23  6:53 AM  Result Value Ref Range   Magnesium 1.8 1.7 - 2.4 mg/dL  Urinalysis, Routine w reflex microscopic -Urine, Clean Catch     Status: Abnormal   Collection Time: 03/23/23  7:20 AM  Result Value Ref Range   Color, Urine YELLOW YELLOW   APPearance HAZY (A) CLEAR   Specific Gravity, Urine 1.017 1.005 - 1.030   pH 5.0 5.0 - 8.0  Glucose, UA NEGATIVE NEGATIVE mg/dL   Hgb urine dipstick SMALL (A) NEGATIVE   Bilirubin Urine NEGATIVE NEGATIVE   Ketones, ur  80 (A) NEGATIVE mg/dL   Protein, ur 308 (A) NEGATIVE mg/dL   Nitrite NEGATIVE NEGATIVE   Leukocytes,Ua NEGATIVE NEGATIVE   RBC / HPF 11-20 0 - 5 RBC/hpf   WBC, UA 6-10 0 - 5 WBC/hpf   Bacteria, UA RARE (A) NONE SEEN   Squamous Epithelial / HPF 11-20 0 - 5 /HPF   Mucus PRESENT   Protein / creatinine ratio, urine     Status: Abnormal   Collection Time: 03/23/23  7:20 AM  Result Value Ref Range   Creatinine, Urine 113 mg/dL   Total Protein, Urine 59 mg/dL   Protein Creatinine Ratio 0.52 (H) 0.00 - 0.15 mg/mg[Cre]   No results found.  MDM Start IV LR Bolus Antiemetic EFM Assessment and Plan  22 year old G3P1011  SIUP at 51 weeks Cat I FT N/V  -POC Reviewed -Exam performed. -Start IV with LR bolus -Phenergan IV -Will monitor and reassess -CMP for evaluation of K+ -Rapid UDS as patient admits MJ usage yesterday, but appears lethargic.   Cherre Robins MSN, CNM 03/24/2023, 5:52 AM   Reassessment (8:29 AM) -Report given to Tyler Aas, CNM  Cherre Robins MSN, CNM Advanced Practice Provider, [facility]  While receiving report on the patient, she demanded to be discharged. With her potassium at 2.9 and her demonstrated inability to keep down meds, it is recommended she stay for IV potassium replacement. Pt refused and left AMA.  Edd Arbour, CNM, MSN, IBCLC Certified Nurse Midwife, [facility]

## 2023-03-26 ENCOUNTER — Ambulatory Visit (INDEPENDENT_AMBULATORY_CARE_PROVIDER_SITE_OTHER): Payer: BC Managed Care – PPO | Admitting: *Deleted

## 2023-03-26 VITALS — BP 133/84

## 2023-03-26 DIAGNOSIS — O1403 Mild to moderate pre-eclampsia, third trimester: Secondary | ICD-10-CM

## 2023-03-26 DIAGNOSIS — Z3A36 36 weeks gestation of pregnancy: Secondary | ICD-10-CM

## 2023-03-26 MED ORDER — SCOPOLAMINE 1 MG/3DAYS TD PT72: 1.0000 | MEDICATED_PATCH | TRANSDERMAL | Status: DC

## 2023-03-26 MED ORDER — POTASSIUM CHLORIDE CRYS ER 20 MEQ PO TBCR: 20.0000 meq | EXTENDED_RELEASE_TABLET | Freq: Two times a day (BID) | ORAL | 1 refills | Status: DC

## 2023-03-26 NOTE — Progress Notes (Signed)
Subjective:  Amy Singh is a 22 y.o. female here for BP check.   Hypertension ROS: Patient denies any headaches, visual symptoms, RUQ/epigastric pain or other concerning symptoms. Pt very nauseated and has been vomiting for days, and was seen in MAU twice and left twice AMA prior to getting potassium.  Objective:  BP 133/84   LMP 07/25/2022 (Approximate)   Appearance alert, and ill-appearing. General exam BP noted to be stable  today in office.    Assessment:   Blood Pressure stable.   Plan:  Discussed patient status with Dr Vergie Living, we will send in oral potassium for pt to try and take and scope patch RX. Pt had ROB on 03/28/23, pt advised to keep appt and if not able  to tolerate PO potassium and unable to keep anything down to go back to MAU.   Scheryl Marten, RN

## 2023-03-28 ENCOUNTER — Encounter: Payer: BC Managed Care – PPO | Admitting: Obstetrics and Gynecology

## 2023-03-28 ENCOUNTER — Other Ambulatory Visit: Payer: Self-pay

## 2023-03-28 ENCOUNTER — Encounter (HOSPITAL_COMMUNITY): Payer: Self-pay | Admitting: Obstetrics and Gynecology

## 2023-03-28 ENCOUNTER — Telehealth: Payer: Self-pay | Admitting: *Deleted

## 2023-03-28 ENCOUNTER — Inpatient Hospital Stay (HOSPITAL_COMMUNITY)
Admission: AD | Admit: 2023-03-28 | Discharge: 2023-04-02 | DRG: 807 | Disposition: A | Discharge status: Home / Self Care | Payer: BC Managed Care – PPO | Attending: Obstetrics and Gynecology | Admitting: Obstetrics and Gynecology

## 2023-03-28 DIAGNOSIS — O1414 Severe pre-eclampsia complicating childbirth: Secondary | ICD-10-CM | POA: Diagnosis not present

## 2023-03-28 DIAGNOSIS — F1290 Cannabis use, unspecified, uncomplicated: Secondary | ICD-10-CM | POA: Diagnosis not present

## 2023-03-28 DIAGNOSIS — R112 Nausea with vomiting, unspecified: Secondary | ICD-10-CM

## 2023-03-28 DIAGNOSIS — Z3A37 37 weeks gestation of pregnancy: Secondary | ICD-10-CM | POA: Diagnosis not present

## 2023-03-28 DIAGNOSIS — R7401 Elevation of levels of liver transaminase levels: Secondary | ICD-10-CM | POA: Diagnosis not present

## 2023-03-28 DIAGNOSIS — K219 Gastro-esophageal reflux disease without esophagitis: Secondary | ICD-10-CM | POA: Diagnosis not present

## 2023-03-28 DIAGNOSIS — O99284 Endocrine, nutritional and metabolic diseases complicating childbirth: Secondary | ICD-10-CM | POA: Diagnosis not present

## 2023-03-28 DIAGNOSIS — O9962 Diseases of the digestive system complicating childbirth: Secondary | ICD-10-CM | POA: Diagnosis present

## 2023-03-28 DIAGNOSIS — F1291 Cannabis use, unspecified, in remission: Secondary | ICD-10-CM | POA: Diagnosis present

## 2023-03-28 DIAGNOSIS — E876 Hypokalemia: Secondary | ICD-10-CM | POA: Diagnosis present

## 2023-03-28 DIAGNOSIS — O99013 Anemia complicating pregnancy, third trimester: Secondary | ICD-10-CM | POA: Diagnosis present

## 2023-03-28 DIAGNOSIS — Z148 Genetic carrier of other disease: Secondary | ICD-10-CM | POA: Diagnosis not present

## 2023-03-28 DIAGNOSIS — O0990 Supervision of high risk pregnancy, unspecified, unspecified trimester: Secondary | ICD-10-CM

## 2023-03-28 DIAGNOSIS — O9902 Anemia complicating childbirth: Secondary | ICD-10-CM | POA: Diagnosis present

## 2023-03-28 DIAGNOSIS — O1413 Severe pre-eclampsia, third trimester: Secondary | ICD-10-CM | POA: Diagnosis not present

## 2023-03-28 DIAGNOSIS — R079 Chest pain, unspecified: Secondary | ICD-10-CM | POA: Diagnosis not present

## 2023-03-28 DIAGNOSIS — Z8759 Personal history of other complications of pregnancy, childbirth and the puerperium: Secondary | ICD-10-CM

## 2023-03-28 DIAGNOSIS — O99324 Drug use complicating childbirth: Secondary | ICD-10-CM | POA: Diagnosis not present

## 2023-03-28 DIAGNOSIS — Z3A36 36 weeks gestation of pregnancy: Secondary | ICD-10-CM | POA: Diagnosis not present

## 2023-03-28 DIAGNOSIS — O212 Late vomiting of pregnancy: Secondary | ICD-10-CM | POA: Diagnosis not present

## 2023-03-28 DIAGNOSIS — O1403 Mild to moderate pre-eclampsia, third trimester: Secondary | ICD-10-CM | POA: Diagnosis not present

## 2023-03-28 DIAGNOSIS — O210 Mild hyperemesis gravidarum: Secondary | ICD-10-CM | POA: Diagnosis not present

## 2023-03-28 DIAGNOSIS — O09899 Supervision of other high risk pregnancies, unspecified trimester: Secondary | ICD-10-CM

## 2023-03-28 DIAGNOSIS — O09292 Supervision of pregnancy with other poor reproductive or obstetric history, second trimester: Secondary | ICD-10-CM | POA: Diagnosis not present

## 2023-03-28 LAB — CBC
HCT: 43.8 % (ref 36.0–46.0)
Hemoglobin: 14.3 g/dL (ref 12.0–15.0)
MCH: 27.2 pg (ref 26.0–34.0)
MCHC: 32.6 g/dL (ref 30.0–36.0)
MCV: 83.4 fL (ref 80.0–100.0)
Platelets: 254 10*3/uL (ref 150–400)
RBC: 5.25 MIL/uL — ABNORMAL HIGH (ref 3.87–5.11)
RDW: 17.3 % — ABNORMAL HIGH (ref 11.5–15.5)
WBC: 12.3 10*3/uL — ABNORMAL HIGH (ref 4.0–10.5)
nRBC: 0 % (ref 0.0–0.2)

## 2023-03-28 LAB — BASIC METABOLIC PANEL
Anion gap: 15 (ref 5–15)
BUN: <5 mg/dL — ABNORMAL LOW (ref 6–20)
CO2: 18 mmol/L — ABNORMAL LOW (ref 22–32)
Calcium: 9 mg/dL (ref 8.9–10.3)
Chloride: 103 mmol/L (ref 98–111)
Creatinine, Ser: 0.82 mg/dL (ref 0.44–1.00)
GFR, Estimated: >60 mL/min (ref >60)
Glucose, Bld: 63 mg/dL — ABNORMAL LOW (ref 70–99)
Potassium: 2.7 mmol/L — CL (ref 3.5–5.1)
Sodium: 136 mmol/L (ref 135–145)

## 2023-03-28 LAB — URINALYSIS, ROUTINE W REFLEX MICROSCOPIC
Bilirubin Urine: NEGATIVE
Glucose, UA: NEGATIVE mg/dL
Ketones, ur: 80 mg/dL — AB
Nitrite: NEGATIVE
Protein, ur: 100 mg/dL — AB
RBC / HPF: >50 RBC/hpf (ref 0–5)
Specific Gravity, Urine: 1.02 (ref 1.005–1.030)
pH: 6 (ref 5.0–8.0)

## 2023-03-28 LAB — PROTEIN / CREATININE RATIO, URINE
Creatinine, Urine: 212 mg/dL
Protein Creatinine Ratio: 0.52 mg/mg{Cre} — ABNORMAL HIGH (ref 0.00–0.15)
Total Protein, Urine: 111 mg/dL

## 2023-03-28 LAB — COMPREHENSIVE METABOLIC PANEL
ALT: 67 U/L — ABNORMAL HIGH (ref 0–44)
AST: 51 U/L — ABNORMAL HIGH (ref 15–41)
Albumin: 3.3 g/dL — ABNORMAL LOW (ref 3.5–5.0)
Alkaline Phosphatase: 147 U/L — ABNORMAL HIGH (ref 38–126)
Anion gap: 17 — ABNORMAL HIGH (ref 5–15)
BUN: <5 mg/dL — ABNORMAL LOW (ref 6–20)
CO2: 18 mmol/L — ABNORMAL LOW (ref 22–32)
Calcium: 9.4 mg/dL (ref 8.9–10.3)
Chloride: 99 mmol/L (ref 98–111)
Creatinine, Ser: 0.91 mg/dL (ref 0.44–1.00)
GFR, Estimated: >60 mL/min (ref >60)
Glucose, Bld: 71 mg/dL (ref 70–99)
Potassium: 2.5 mmol/L — CL (ref 3.5–5.1)
Sodium: 134 mmol/L — ABNORMAL LOW (ref 135–145)
Total Bilirubin: 3.7 mg/dL — ABNORMAL HIGH (ref 0.3–1.2)
Total Protein: 7.2 g/dL (ref 6.5–8.1)

## 2023-03-28 LAB — TYPE AND SCREEN
ABO/RH(D): A POS
Antibody Screen: NEGATIVE

## 2023-03-28 MED ORDER — PANTOPRAZOLE SODIUM 40 MG PO TBEC
40.0000 mg | DELAYED_RELEASE_TABLET | Freq: Every day | ORAL | Status: DC
  Administered 2023-03-30: 40 mg via ORAL
  Filled 2023-03-28 (×3): qty 1

## 2023-03-28 MED ORDER — PROMETHAZINE HCL 25 MG PO TABS: 12.5000 mg | ORAL_TABLET | ORAL | Status: DC | PRN

## 2023-03-28 MED ORDER — PRENATAL MULTIVITAMIN CH
1.0000 | ORAL_TABLET | Freq: Every day | ORAL | Status: DC
  Filled 2023-03-28: qty 1

## 2023-03-28 MED ORDER — SODIUM CHLORIDE 0.9 % IV SOLN: 8.0000 mg | Freq: Three times a day (TID) | INTRAVENOUS | Status: DC | PRN

## 2023-03-28 MED ORDER — PANTOPRAZOLE SODIUM 40 MG IV SOLR
40.0000 mg | Freq: Once | INTRAVENOUS | Status: AC
  Administered 2023-03-28: 40 mg via INTRAVENOUS
  Filled 2023-03-28: qty 10

## 2023-03-28 MED ORDER — ONDANSETRON HCL 4 MG/2ML IJ SOLN
4.0000 mg | Freq: Three times a day (TID) | INTRAMUSCULAR | Status: DC | PRN
  Administered 2023-03-29: 4 mg via INTRAVENOUS
  Filled 2023-03-28: qty 2

## 2023-03-28 MED ORDER — CALCIUM CARBONATE ANTACID 500 MG PO CHEW: 2.0000 | CHEWABLE_TABLET | ORAL | Status: DC | PRN

## 2023-03-28 MED ORDER — SODIUM CHLORIDE 0.9 % IV SOLN: INTRAVENOUS | Status: DC

## 2023-03-28 MED ORDER — METOCLOPRAMIDE HCL 5 MG/ML IJ SOLN
10.0000 mg | Freq: Four times a day (QID) | INTRAMUSCULAR | Status: DC
  Administered 2023-03-28 – 2023-03-30 (×7): 10 mg via INTRAVENOUS
  Filled 2023-03-28 (×7): qty 2

## 2023-03-28 MED ORDER — HYDROXYZINE HCL 50 MG/ML IM SOLN: 50.0000 mg | Freq: Four times a day (QID) | INTRAMUSCULAR | Status: DC | PRN

## 2023-03-28 MED ORDER — PROMETHAZINE HCL 25 MG/ML IJ SOLN
25.0000 mg | Freq: Once | INTRAVENOUS | Status: AC
  Administered 2023-03-28: 25 mg via INTRAVENOUS
  Filled 2023-03-28: qty 1

## 2023-03-28 MED ORDER — DOCUSATE SODIUM 100 MG PO CAPS
100.0000 mg | ORAL_CAPSULE | Freq: Every day | ORAL | Status: DC
  Filled 2023-03-28 (×2): qty 1

## 2023-03-28 MED ORDER — ONDANSETRON 4 MG PO TBDP: 4.0000 mg | ORAL_TABLET | Freq: Three times a day (TID) | ORAL | Status: DC | PRN

## 2023-03-28 MED ORDER — METOCLOPRAMIDE HCL 10 MG PO TABS
10.0000 mg | ORAL_TABLET | Freq: Four times a day (QID) | ORAL | Status: DC
  Filled 2023-03-28 (×6): qty 1

## 2023-03-28 MED ORDER — ASPIRIN 81 MG PO TBEC
81.0000 mg | DELAYED_RELEASE_TABLET | Freq: Every day | ORAL | Status: DC
  Filled 2023-03-28 (×3): qty 1

## 2023-03-28 MED ORDER — ALUM & MAG HYDROXIDE-SIMETH 200-200-20 MG/5ML PO SUSP
30.0000 mL | Freq: Once | ORAL | Status: AC
  Administered 2023-03-28: 30 mL via ORAL
  Filled 2023-03-28: qty 30

## 2023-03-28 MED ORDER — LACTATED RINGERS IV SOLN: 125.0000 mL/h | INTRAVENOUS | Status: AC

## 2023-03-28 MED ORDER — HYDROXYZINE HCL 50 MG PO TABS: 50.0000 mg | ORAL_TABLET | Freq: Four times a day (QID) | ORAL | Status: DC | PRN

## 2023-03-28 MED ORDER — ACETAMINOPHEN 325 MG PO TABS: 650.0000 mg | ORAL_TABLET | ORAL | Status: DC | PRN

## 2023-03-28 MED ORDER — POTASSIUM CHLORIDE 10 MEQ/100ML IV SOLN
10.0000 meq | INTRAVENOUS | Status: AC
  Administered 2023-03-28 (×4): 10 meq via INTRAVENOUS
  Filled 2023-03-28 (×4): qty 100

## 2023-03-28 MED ORDER — SODIUM CHLORIDE 0.9 % IV SOLN
8.0000 mg | Freq: Once | INTRAVENOUS | Status: AC
  Administered 2023-03-28: 8 mg via INTRAVENOUS
  Filled 2023-03-28: qty 4

## 2023-03-28 MED ORDER — PROMETHAZINE HCL 25 MG RE SUPP: 12.5000 mg | RECTAL | Status: DC | PRN

## 2023-03-28 NOTE — H&P (Signed)
FACULTY PRACTICE ANTEPARTUM ADMISSION HISTORY AND PHYSICAL NOTE   History of Present Illness: Amy Singh is a 22 y.o. at [redacted]w[redacted]d with PMHx notable for hyperemesis, MJ use, and recently diagnosed mild pre-eclampsia, who presents for nausea and vomiting.    Patient has been seen three times in the past week for similar symptoms Left AMA prior to completion of evaluation and treatment on 6/25, 6/28, and 6/29   Returns today with similar symptoms of nausea, vomiting, and inability eat At prior visits had significant hypokalemia but refused to stay for repletion Endorses a pain in her chest that she attributes to frequent emesis Denies vaginal bleeding, leaking fluid, or contractions Endorses good fetal movement Denies any fevers Denies headache, vision changes, shortness of breath, RUQ pain, or LE edema    Patient Active Problem List   Diagnosis Date Noted   Hypokalemia due to excessive gastrointestinal loss of potassium 03/28/2023   Mild pre-eclampsia in third trimester 03/23/2023   Chlamydia infection affecting pregnancy in first trimester 10/14/2022   Short interval between pregnancies affecting pregnancy, antepartum 10/12/2022   Supervision of high risk pregnancy, antepartum 10/12/2022   History of severe pre-eclampsia 09/21/2021   Anemia affecting pregnancy in third trimester 07/08/2021   Gonorrhea affecting pregnancy in first trimester 03/16/2021   IUFD at less than 20 weeks of gestation 11/18/2020   History of marijuana use 10/20/2020    Past Medical History:  Diagnosis Date   Alpha thalassemia silent carrier 03/24/2021   On horizon 4   Chlamydia infection affecting pregnancy 03/16/2021   neg toc 04/2021   Gonorrhea affecting pregnancy 03/16/2021   [x ] toc late July: neg toc    History of chlamydia    History of hyperemesis gravidarum 10/20/2020   26lb weight loss in first pregnanc   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   History of molluscum contagiosum    IUFD  at less than 20 weeks of gestation 11/18/2020   Neg lupus anti-coag, anti-phospholipid antibodies, anti-cardiolipin, anti-beta2 glycoprotein    Severe pre-eclampsia 09/21/2021   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   Qualifier: Diagnosis of  By: Barbaraann Barthel MD, [place]      Past Surgical History:  Procedure Laterality Date   WISDOM TOOTH EXTRACTION      OB History  Gravida Para Term Preterm AB Living  3 1 1   1 1   SAB IAB Ectopic Multiple Live Births  1     0 1    # Outcome Date GA Lbr Len/2nd Weight Sex Delivery Anes PTL Lv  3 Current           2 Term 09/21/21 [redacted]w[redacted]d 19:55 / 00:07 3320 g F Vag-Spont None  LIV  1 SAB 11/19/20 [redacted]w[redacted]d            Complications: IUFD at less than 20 weeks of gestation    Social History   Socioeconomic History   Marital status: Single    Spouse name: Not on file   Number of children: Not on file   Years of education: Not on file   Highest education level: Not on file  Occupational History   Not on file  Tobacco Use   Smoking status: Never   Smokeless tobacco: Never  Vaping Use   Vaping Use: Never used  Substance and Sexual Activity   Alcohol use: No   Drug use: Yes    Types: Marijuana    Comment: Last smoked 03/26/2023   Sexual activity: Not Currently  Partners: Male    Comment: Pregnant   Other Topics Concern   Not on file  Social History Narrative   Not on file   Social Determinants of Health   Financial Resource Strain: Not on file  Food Insecurity: Not on file  Transportation Needs: Not on file  Physical Activity: Not on file  Stress: Not on file  Social Connections: Not on file    Family History  Problem Relation Age of Onset   Hypertension Paternal Grandmother    Diabetes Paternal Grandmother     No Known Allergies  Facility-Administered Medications Prior to Admission  Medication Dose Route Frequency Provider Last Rate Last Admin   scopolamine (TRANSDERM-SCOP) 1 MG/3DAYS 1.5 mg  1 patch Transdermal Q72H [place] Bing, MD        Medications Prior to Admission  Medication Sig Dispense Refill Last Dose   acetaminophen (TYLENOL) 500 MG tablet Take 1,000 mg by mouth every 6 (six) hours as needed.      aspirin EC 81 MG tablet Take 1 tablet (81 mg total) by mouth daily. (Patient not taking: Reported on 11/13/2022) 60 tablet 2    ondansetron (ZOFRAN-ODT) 4 MG disintegrating tablet Take 1 tablet (4 mg total) by mouth every 6 (six) hours as needed for nausea. (Patient not taking: Reported on 12/28/2022) 20 tablet 0    pantoprazole (PROTONIX) 40 MG tablet Take 1 tablet (40 mg total) by mouth daily. 30 tablet 1    potassium chloride SA (KLOR-CON M) 20 MEQ tablet Take 1 tablet (20 mEq total) by mouth 2 (two) times daily. 30 tablet 1    promethazine (PHENERGAN) 25 MG tablet Take 1 tablet (25 mg total) by mouth every 6 (six) hours as needed for nausea or vomiting. 30 tablet 1     Review of Systems Pertinent positives and negative per HPI, all others reviewed and negative   Vitals:  BP 119/70 (BP Location: Right Arm)   Pulse 97   Temp 98.1 F (36.7 C) (Oral)   Resp 20   Ht 5\' 2"  (1.575 m)   Wt 82.4 kg   LMP 07/25/2022 (Approximate)   SpO2 99%   BMI 33.22 kg/m   Patient Vitals for the past 24 hrs:   BP Temp Temp src Pulse Resp SpO2 Height Weight  03/28/23 1400 119/70 -- -- 97 -- -- -- --  03/28/23 1345 126/68 -- -- 95 -- -- -- --  03/28/23 1330 119/75 -- -- (!) 104 -- -- -- --  03/28/23 1315 127/87 -- -- (!) 102 -- -- -- --  03/28/23 1300 130/71 -- -- 100 -- -- -- --  03/28/23 1245 129/89 -- -- 99 -- -- -- --  03/28/23 1230 125/87 -- -- (!) 104 -- -- -- --  03/28/23 1221 138/88 -- -- 99 -- -- -- --  03/28/23 1145 (!) 136/91 -- -- 100 -- 99 % -- --  03/28/23 1131 135/82 -- -- (!) 106 -- -- -- --  03/28/23 1115 135/82 98.1 F (36.7 C) Oral (!) 116 20 99 % 5\' 2"  (1.575 m) 82.4 kg      Physical Exam Vitals reviewed.  Constitutional:      General: She is not in acute distress.    Appearance: She is well-developed.  She is not diaphoretic.  Eyes:     General: No scleral icterus. Pulmonary:     Effort: Pulmonary effort is normal. No respiratory distress.  Abdominal:     General: There is no distension.  Palpations: Abdomen is soft.     Tenderness: There is no abdominal tenderness. There is no guarding or rebound.  Skin:    General: Skin is warm and dry.  Neurological:     Mental Status: She is alert.     Coordination: Coordination normal.        Cervical Exam   Bedside Ultrasound Not performed.   My interpretation: n/a   FHT Baseline: 140 bpm Variability: Good {> 6 bpm) Accelerations: Reactive Decelerations: Absent Uterine activity: irritability Cat: I  Labs:  Results for orders placed or performed during the hospital encounter of 03/28/23 (from the past 24 hour(s))  Urinalysis, Routine w reflex microscopic -Urine, Clean Catch   Collection Time: 03/28/23 11:20 AM  Result Value Ref Range   Color, Urine AMBER (A) YELLOW   APPearance HAZY (A) CLEAR   Specific Gravity, Urine 1.020 1.005 - 1.030   pH 6.0 5.0 - 8.0   Glucose, UA NEGATIVE NEGATIVE mg/dL   Hgb urine dipstick MODERATE (A) NEGATIVE   Bilirubin Urine NEGATIVE NEGATIVE   Ketones, ur 80 (A) NEGATIVE mg/dL   Protein, ur 409 (A) NEGATIVE mg/dL   Nitrite NEGATIVE NEGATIVE   Leukocytes,Ua SMALL (A) NEGATIVE   RBC / HPF >50 0 - 5 RBC/hpf   WBC, UA 21-50 0 - 5 WBC/hpf   Bacteria, UA FEW (A) NONE SEEN   Squamous Epithelial / HPF 21-50 0 - 5 /HPF   Mucus PRESENT   Protein / creatinine ratio, urine   Collection Time: 03/28/23 11:39 AM  Result Value Ref Range   Creatinine, Urine 212 mg/dL   Total Protein, Urine 111 mg/dL   Protein Creatinine Ratio 0.52 (H) 0.00 - 0.15 mg/mg[Cre]  Comprehensive metabolic panel   Collection Time: 03/28/23 12:31 PM  Result Value Ref Range   Sodium 134 (L) 135 - 145 mmol/L   Potassium 2.5 (LL) 3.5 - 5.1 mmol/L   Chloride 99 98 - 111 mmol/L   CO2 18 (L) 22 - 32 mmol/L   Glucose, Bld 71 70  - 99 mg/dL   BUN <5 (L) 6 - 20 mg/dL   Creatinine, Ser 8.11 0.44 - 1.00 mg/dL   Calcium 9.4 8.9 - 91.4 mg/dL   Total Protein 7.2 6.5 - 8.1 g/dL   Albumin 3.3 (L) 3.5 - 5.0 g/dL   AST 51 (H) 15 - 41 U/L   ALT 67 (H) 0 - 44 U/L   Alkaline Phosphatase 147 (H) 38 - 126 U/L   Total Bilirubin 3.7 (H) 0.3 - 1.2 mg/dL   GFR, Estimated >78 >29 mL/min   Anion gap 17 (H) 5 - 15  CBC   Collection Time: 03/28/23 12:31 PM  Result Value Ref Range   WBC 12.3 (H) 4.0 - 10.5 K/uL   RBC 5.25 (H) 3.87 - 5.11 MIL/uL   Hemoglobin 14.3 12.0 - 15.0 g/dL   HCT 56.2 13.0 - 86.5 %   MCV 83.4 80.0 - 100.0 fL   MCH 27.2 26.0 - 34.0 pg   MCHC 32.6 30.0 - 36.0 g/dL   RDW 78.4 (H) 69.6 - 29.5 %   Platelets 254 150 - 400 K/uL   nRBC 0.0 0.0 - 0.2 %    Imaging Studies: [place] MFM OB FOLLOW UP  Result Date: 03/16/2023 ----------------------------------------------------------------------  OBSTETRICS REPORT                       (Signed Final 03/16/2023 10:10 am) ---------------------------------------------------------------------- Patient Info  ID #:  [id number]                          D.O.B.:  December 13, 2000 (21 yrs)  Name:       SHYLO SHANES                  Visit Date: 03/16/2023 09:37 am ---------------------------------------------------------------------- Performed By  Attending:        Braxton Feathers DO       Ref. Address:     [address]                                                             [place], [place]                                                             [address]  Performed By:     Reinaldo Raddle            [facility]  Referred By:      Monee Bing MD ---------------------------------------------------------------------- Orders  #  Description                            Code        Ordered By  1  [place] MFM OB FOLLOW UP                   84696.29    Noralee Space ----------------------------------------------------------------------  #  Order #                     Accession #                Episode #  1  [id number]  [id number]                 [id number] ---------------------------------------------------------------------- Indications  Obesity complicating pregnancy, second         O99.212  trimester (pre-G BMI 30)  Poor obstetric history: Previous severe        O09.299  preeclampsia  Genetic carrier (Silent carrier Alpha Thal)    Z14.8  [redacted] weeks gestation of pregnancy                Z3A.34  LR NIPS - Female, Negative AFP ---------------------------------------------------------------------- Fetal Evaluation  Num Of Fetuses:         1  Fetal Heart Rate(bpm):  151  Cardiac Activity:       Observed  Presentation:           Cephalic  Placenta:               Anterior  P. Cord Insertion:      Visualized, central  Amniotic Fluid  AFI FV:      Within normal limits  AFI Sum(cm)     %Tile       Largest Pocket(cm)  19.14           71          6.73  RUQ(cm)       RLQ(cm)       LUQ(cm)        LLQ(cm)  6.36          3.8           6.73           2.25 ---------------------------------------------------------------------- Biometry  BPD:      84.8  mm     G. Age:  34w 1d         31  %    CI:        73.86   %    70 - 86                                                          FL/HC:      20.7   %    20.1 - 22.3  HC:      313.4  mm     G. Age:  35w 1d         22  %    HC/AC:      0.95        0.93 - 1.11  AC:       331   mm     G. Age:  37w 0d         96  %    FL/BPD:     76.7   %    71 - 87  FL:         65  mm     G. Age:  33w 4d         13  %    FL/AC:      19.6   %    20 - 24  HUM:      59.7  mm     G. Age:  34w 5d         59  %  LV:          2  mm  Est. FW:    2725  gm           6 lb     69  % ---------------------------------------------------------------------- OB  History  Gravidity:    3         Term:   1        Prem:   0        SAB:   1  TOP:          0       Ectopic:  0        Living: 1 ---------------------------------------------------------------------- Gestational Age  LMP:           33w 3d        Date:  07/25/22                 EDD:   05/01/23  U/S Today:     35w 0d                                        EDD:   04/20/23  Best:          34w 6d     Det. ByMarcella Dubs         EDD:   04/21/23                                      (09/29/22) ---------------------------------------------------------------------- Anatomy  Cranium:               Appears normal         LVOT:                   Previously seen  Cavum:                 Appears normal         Aortic Arch:            Previously seen  Ventricles:            Appears normal         Ductal Arch:            Previously seen  Choroid Plexus:        Previously seen        Diaphragm:              Appears normal  Cerebellum:            Previously seen        Stomach:                Appears normal, left                                                                        sided  Posterior Fossa:       Previously seen        Abdomen:                Appears normal  Nuchal Fold:  Previously seen        Abdominal Wall:         Previously seen  Face:                  Profile nl; orbits     Cord Vessels:           Previously seen                         prev visualized  Lips:                  Previously seen        Kidneys:                Appear normal  Palate:                Previously             Bladder:                Appears normal                         visualized  Thoracic:              Appears normal         Spine:                  Previously seen  Heart:                 Appears normal         Upper Extremities:      Previously seen                         (4CH, axis, and                         situs)  RVOT:                  Previously seen        Lower Extremities:      Previously seen  Other:  Female gender,  Heels/feet, open hands/5th digits, lenses, maxilla,          mandible and falx, VC, 3VV, and 3VTV  previously visualized. ---------------------------------------------------------------------- Cervix Uterus Adnexa  Cervix  Not visualized (advanced GA >24wks)  Uterus  No abnormality visualized.  Right Ovary  Not visualized.  Left Ovary  Size(cm)     3.28   x   2.06   x  1.52      Vol(ml): 5.38  Within normal limits.  Cul De Sac  No free fluid seen.  Adnexa  No adnexal mass visualized ---------------------------------------------------------------------- Comments  The patient is here for a follow-up ultrasound at 34w 6d for  THC use and hx of preE. EDD: 04/21/2023 dated by [facility]  (09/29/22). She has no concerns today.  Sonographic findings  Single intrauterine pregnancy.  Fetal cardiac activity:  Observed and appears normal.  Presentation: Cephalic.  Interval fetal anatomy appears normal.  Fetal biometry shows the estimated fetal weight at the 69  percentile.  Amniotic fluid volume: Within normal limits. MVP: 6.73 cm.  Placenta: Anterior.  Recommendations  -No further [place] are indicatd at this time. F/u as clinically  indicated ----------------------------------------------------------------------  Braxton Feathers, DO Electronically Signed Final Report   03/16/2023 10:10 am ----------------------------------------------------------------------    Assessment and Plan: Patient Active Problem List   Diagnosis Date Noted   Hypokalemia due to excessive gastrointestinal loss of potassium 03/28/2023   Mild pre-eclampsia in third trimester 03/23/2023   Chlamydia infection affecting pregnancy in first trimester 10/14/2022   Short interval between pregnancies affecting pregnancy, antepartum 10/12/2022   Supervision of high risk pregnancy, antepartum 10/12/2022   History of severe pre-eclampsia 09/21/2021   Anemia affecting pregnancy in third trimester 07/08/2021   Gonorrhea affecting  pregnancy in first trimester 03/16/2021   IUFD at less than 20 weeks of gestation 11/18/2020   History of marijuana use 10/20/2020   #Severe hypokalemia #Nausea and vomiting of pregnancy, 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 On CMP she has severe hypokalemia at 2.5 requiring repletion. Given extended time required as well as significant n/v will admit to antepartum unit. Signed out to Dr. Donavan Foil.   #Chest pain #GERD Significantly improved with IV and PO meds. No suspicion for ACS or other primary cardiac/intrathoracic etiology.    #Pre-eclampsia without severe features #Transaminitis #Hyperbilirubinemia Unlikely etiology of her symptoms. No severe features on labs, LFT's are trending upward but not meeting criteria and are more likely driven by poor PO intake and n/v. Trend LFTs. Plan for delivery at [redacted]w[redacted]d, may still be in house at that time and can be transferred to L&D.   #FWB FHT Cat I NST: Reactive     Dispo: admit to antepartum unit   Venora Maples, MD/MPH Attending Family Medicine Physician, [facility] for [facility], [facility]  2:34 PM 03/28/23

## 2023-03-28 NOTE — Plan of Care (Signed)
  Problem: Education: Goal: Knowledge of General Education information will improve Description: Including pain rating scale, medication(s)/side effects and non-pharmacologic comfort measures Outcome: Completed/Met   

## 2023-03-28 NOTE — Progress Notes (Signed)
Date and time results received: 03/28/23 1357 (use smartphrase ".now" to insert current time)  Test: CMP Critical Value: 2.5  Name of Provider Notified: Dr. Crissie Reese  Orders Received? Or Actions Taken?: Orders Received - See Orders for details

## 2023-03-28 NOTE — MAU Note (Signed)
Amy Singh is a 22 y.o. at [redacted]w[redacted]d here in MAU reporting: keeps having high blood pressure and she can't keep nothing down.  Is not on BP meds.  Less took nausea meds yesterday, "it;s not working" Denies HA, visual changes, epigastric pain or increase in swelling.  Sharp pains between her breasts. Thinks she lost her mucous plug this morning, no bleeding, no LOF, reports +FM Onset of complaint: ongoing Pain score: mild Vitals:   03/28/23 1115  BP: 135/82  Pulse: (!) 116  Resp: 20  Temp: 98.1 F (36.7 C)  SpO2: 99%     FHT:154 Lab orders placed from triage:  urine

## 2023-03-28 NOTE — Telephone Encounter (Signed)
Pt called saying her BP this morning was 150/91 and has not been able to keep down the potassium pills. Advised pt that she needs to go to MAU to be further evaluated for her BPs and get the IV potassium thay have tried to give her the last 2 times she went to MAU. Pt states she will have her mom take her and we will cancel her OB visit for this afternoon.

## 2023-03-28 NOTE — MAU Provider Note (Signed)
History     [id number]  Arrival date and time: 03/28/23 1050    Chief Complaint  Patient presents with   Emesis   Hypertension     HPI Amy Singh is a 22 y.o. at [redacted]w[redacted]d with PMHx notable for hyperemesis, MJ use, and recently diagnosed mild pre-eclampsia, who presents for nausea and vomiting.   Patient has been seen three times in the past week for similar symptoms Left AMA prior to completion of evaluation and treatment on 6/25, 6/28, and 6/29  Returns today with similar symptoms of nausea, vomiting, and inability eat At prior visits had significant hypokalemia but refused to stay for repletion Endorses a pain in her chest that she attributes to frequent emesis Denies vaginal bleeding, leaking fluid, or contractions Endorses good fetal movement Denies any fevers Denies headache, vision changes, shortness of breath, RUQ pain, or LE edema   A/Positive/-- (01/18 1050)  OB History     Gravida  3   Para  1   Term  1   Preterm      AB  1   Living  1      SAB  1   IAB      Ectopic      Multiple  0   Live Births  1           Past Medical History:  Diagnosis Date   Alpha thalassemia silent carrier 03/24/2021   On horizon 4   Chlamydia infection affecting pregnancy 03/16/2021   neg toc 04/2021   Gonorrhea affecting pregnancy 03/16/2021   [x ] toc late July: neg toc    History of chlamydia    History of hyperemesis gravidarum 10/20/2020   26lb weight loss in first pregnanc   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   History of molluscum contagiosum    IUFD at less than 20 weeks of gestation 11/18/2020   Neg lupus anti-coag, anti-phospholipid antibodies, anti-cardiolipin, anti-beta2 glycoprotein    Severe pre-eclampsia 09/21/2021   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   Qualifier: Diagnosis of  By: Barbaraann Barthel MD, [place]      Past Surgical History:  Procedure Laterality Date   WISDOM TOOTH EXTRACTION      Family History  Problem Relation Age of Onset    Hypertension Paternal Grandmother    Diabetes Paternal Grandmother     Social History   Socioeconomic History   Marital status: Single    Spouse name: Not on file   Number of children: Not on file   Years of education: Not on file   Highest education level: Not on file  Occupational History   Not on file  Tobacco Use   Smoking status: Never   Smokeless tobacco: Never  Vaping Use   Vaping Use: Never used  Substance and Sexual Activity   Alcohol use: No   Drug use: Yes    Types: Marijuana    Comment: Last smoked 03/26/2023   Sexual activity: Not Currently    Partners: Male    Comment: Pregnant   Other Topics Concern   Not on file  Social History Narrative   Not on file   Social Determinants of Health   Financial Resource Strain: Not on file  Food Insecurity: Not on file  Transportation Needs: Not on file  Physical Activity: Not on file  Stress: Not on file  Social Connections: Not on file  Intimate Partner Violence: Not on file    No Known Allergies  No current facility-administered  medications on file prior to encounter.   Current Outpatient Medications on File Prior to Encounter  Medication Sig Dispense Refill   acetaminophen (TYLENOL) 500 MG tablet Take 1,000 mg by mouth every 6 (six) hours as needed.     aspirin EC 81 MG tablet Take 1 tablet (81 mg total) by mouth daily. (Patient not taking: Reported on 11/13/2022) 60 tablet 2   ondansetron (ZOFRAN-ODT) 4 MG disintegrating tablet Take 1 tablet (4 mg total) by mouth every 6 (six) hours as needed for nausea. (Patient not taking: Reported on 12/28/2022) 20 tablet 0   pantoprazole (PROTONIX) 40 MG tablet Take 1 tablet (40 mg total) by mouth daily. 30 tablet 1   potassium chloride SA (KLOR-CON M) 20 MEQ tablet Take 1 tablet (20 mEq total) by mouth 2 (two) times daily. 30 tablet 1   promethazine (PHENERGAN) 25 MG tablet Take 1 tablet (25 mg total) by mouth every 6 (six) hours as needed for nausea or vomiting. 30 tablet  1     ROS Pertinent positives and negative per HPI, all others reviewed and negative  Physical Exam   BP 119/70 (BP Location: Right Arm)   Pulse 97   Temp 98.1 F (36.7 C) (Oral)   Resp 20   Ht 5\' 2"  (1.575 m)   Wt 82.4 kg   LMP 07/25/2022 (Approximate)   SpO2 99%   BMI 33.22 kg/m   Patient Vitals for the past 24 hrs:  BP Temp Temp src Pulse Resp SpO2 Height Weight  03/28/23 1400 119/70 -- -- 97 -- -- -- --  03/28/23 1345 126/68 -- -- 95 -- -- -- --  03/28/23 1330 119/75 -- -- (!) 104 -- -- -- --  03/28/23 1315 127/87 -- -- (!) 102 -- -- -- --  03/28/23 1300 130/71 -- -- 100 -- -- -- --  03/28/23 1245 129/89 -- -- 99 -- -- -- --  03/28/23 1230 125/87 -- -- (!) 104 -- -- -- --  03/28/23 1221 138/88 -- -- 99 -- -- -- --  03/28/23 1145 (!) 136/91 -- -- 100 -- 99 % -- --  03/28/23 1131 135/82 -- -- (!) 106 -- -- -- --  03/28/23 1115 135/82 98.1 F (36.7 C) Oral (!) 116 20 99 % 5\' 2"  (1.575 m) 82.4 kg    Physical Exam Vitals reviewed.  Constitutional:      General: She is not in acute distress.    Appearance: She is well-developed. She is not diaphoretic.  Eyes:     General: No scleral icterus. Pulmonary:     Effort: Pulmonary effort is normal. No respiratory distress.  Abdominal:     General: There is no distension.     Palpations: Abdomen is soft.     Tenderness: There is no abdominal tenderness. There is no guarding or rebound.  Skin:    General: Skin is warm and dry.  Neurological:     Mental Status: She is alert.     Coordination: Coordination normal.      Cervical Exam    Bedside Ultrasound Not performed.  My interpretation: n/a  FHT Baseline: 140 bpm Variability: Good {> 6 bpm) Accelerations: Reactive Decelerations: Absent Uterine activity: irritability Cat: I  Labs Results for orders placed or performed during the hospital encounter of 03/28/23 (from the past 24 hour(s))  Urinalysis, Routine w reflex microscopic -Urine, Clean Catch      Status: Abnormal   Collection Time: 03/28/23 11:20 AM  Result Value Ref Range  Color, Urine AMBER (A) YELLOW   APPearance HAZY (A) CLEAR   Specific Gravity, Urine 1.020 1.005 - 1.030   pH 6.0 5.0 - 8.0   Glucose, UA NEGATIVE NEGATIVE mg/dL   Hgb urine dipstick MODERATE (A) NEGATIVE   Bilirubin Urine NEGATIVE NEGATIVE   Ketones, ur 80 (A) NEGATIVE mg/dL   Protein, ur 161 (A) NEGATIVE mg/dL   Nitrite NEGATIVE NEGATIVE   Leukocytes,Ua SMALL (A) NEGATIVE   RBC / HPF >50 0 - 5 RBC/hpf   WBC, UA 21-50 0 - 5 WBC/hpf   Bacteria, UA FEW (A) NONE SEEN   Squamous Epithelial / HPF 21-50 0 - 5 /HPF   Mucus PRESENT   Protein / creatinine ratio, urine     Status: Abnormal   Collection Time: 03/28/23 11:39 AM  Result Value Ref Range   Creatinine, Urine 212 mg/dL   Total Protein, Urine 111 mg/dL   Protein Creatinine Ratio 0.52 (H) 0.00 - 0.15 mg/mg[Cre]  Comprehensive metabolic panel     Status: Abnormal   Collection Time: 03/28/23 12:31 PM  Result Value Ref Range   Sodium 134 (L) 135 - 145 mmol/L   Potassium 2.5 (LL) 3.5 - 5.1 mmol/L   Chloride 99 98 - 111 mmol/L   CO2 18 (L) 22 - 32 mmol/L   Glucose, Bld 71 70 - 99 mg/dL   BUN <5 (L) 6 - 20 mg/dL   Creatinine, Ser 0.96 0.44 - 1.00 mg/dL   Calcium 9.4 8.9 - 04.5 mg/dL   Total Protein 7.2 6.5 - 8.1 g/dL   Albumin 3.3 (L) 3.5 - 5.0 g/dL   AST 51 (H) 15 - 41 U/L   ALT 67 (H) 0 - 44 U/L   Alkaline Phosphatase 147 (H) 38 - 126 U/L   Total Bilirubin 3.7 (H) 0.3 - 1.2 mg/dL   GFR, Estimated >40 >98 mL/min   Anion gap 17 (H) 5 - 15  CBC     Status: Abnormal   Collection Time: 03/28/23 12:31 PM  Result Value Ref Range   WBC 12.3 (H) 4.0 - 10.5 K/uL   RBC 5.25 (H) 3.87 - 5.11 MIL/uL   Hemoglobin 14.3 12.0 - 15.0 g/dL   HCT 11.9 14.7 - 82.9 %   MCV 83.4 80.0 - 100.0 fL   MCH 27.2 26.0 - 34.0 pg   MCHC 32.6 30.0 - 36.0 g/dL   RDW 56.2 (H) 13.0 - 86.5 %   Platelets 254 150 - 400 K/uL   nRBC 0.0 0.0 - 0.2 %    Imaging No results  found.  MAU Course  Procedures Lab Orders         Urinalysis, Routine w reflex microscopic -Urine, Clean Catch         Comprehensive metabolic panel         CBC         Protein / creatinine ratio, urine         Comprehensive metabolic panel    Meds ordered this encounter  Medications   pantoprazole (PROTONIX) injection 40 mg   alum & mag hydroxide-simeth (MAALOX/MYLANTA) 200-200-20 MG/5ML suspension 30 mL   ondansetron (ZOFRAN) 8 mg in sodium chloride 0.9 % 50 mL IVPB   promethazine (PHENERGAN) 25 mg in lactated ringers 1,000 mL infusion   potassium chloride 10 mEq in 100 mL IVPB   0.9 %  sodium chloride infusion   lactated ringers infusion   acetaminophen (TYLENOL) tablet 650 mg   docusate sodium (COLACE) capsule 100 mg  calcium carbonate (TUMS - dosed in mg elemental calcium) chewable tablet 400 mg of elemental calcium   prenatal multivitamin tablet 1 tablet   OR Linked Order Group    metoCLOPramide (REGLAN) tablet 10 mg    metoCLOPramide (REGLAN) injection 10 mg   OR Linked Order Group    ondansetron (ZOFRAN-ODT) disintegrating tablet 4-8 mg    ondansetron (ZOFRAN) injection 4 mg    ondansetron (ZOFRAN) 8 mg in sodium chloride 0.9 % 50 mL IVPB   OR Linked Order Group    promethazine (PHENERGAN) tablet 12.5-25 mg    promethazine (PHENERGAN) suppository 12.5-25 mg   OR Linked Order Group    hydrOXYzine (ATARAX) tablet 50 mg    hydrOXYzine (VISTARIL) injection 50 mg   Imaging Orders  No imaging studies ordered today    MDM High (Level 5)  Assessment and Plan  #Severe hypokalemia #Nausea and vomiting of pregnancy, 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 On CMP she has severe hypokalemia at 2.5 requiring repletion. Given extended time required as well as significant n/v will admit to antepartum unit. Signed out to Dr. Donavan Foil.  #Chest pain #GERD Significantly improved with IV and PO  meds. No suspicion for ACS or other primary cardiac/intrathoracic etiology.   #Pre-eclampsia without severe features #Transaminitis #Hyperbilirubinemia Unlikely etiology of her symptoms. No severe features on labs, LFT's are trending upward but not meeting criteria and are more likely driven by poor PO intake and n/v. Trend LFTs. Plan for delivery at [redacted]w[redacted]d, may still be in house at that time and can be transferred to L&D.  #FWB FHT Cat I NST: Reactive   Dispo: admit to antepartum unit    Venora Maples, MD/MPH 03/28/23 2:25 PM

## 2023-03-29 ENCOUNTER — Other Ambulatory Visit: Payer: Self-pay | Admitting: Family Medicine

## 2023-03-29 DIAGNOSIS — O09292 Supervision of pregnancy with other poor reproductive or obstetric history, second trimester: Secondary | ICD-10-CM | POA: Diagnosis not present

## 2023-03-29 DIAGNOSIS — E876 Hypokalemia: Secondary | ICD-10-CM

## 2023-03-29 DIAGNOSIS — Z148 Genetic carrier of other disease: Secondary | ICD-10-CM | POA: Diagnosis not present

## 2023-03-29 DIAGNOSIS — O1413 Severe pre-eclampsia, third trimester: Secondary | ICD-10-CM | POA: Diagnosis not present

## 2023-03-29 DIAGNOSIS — O1414 Severe pre-eclampsia complicating childbirth: Secondary | ICD-10-CM | POA: Diagnosis present

## 2023-03-29 DIAGNOSIS — R079 Chest pain, unspecified: Secondary | ICD-10-CM | POA: Diagnosis present

## 2023-03-29 DIAGNOSIS — O99324 Drug use complicating childbirth: Secondary | ICD-10-CM | POA: Diagnosis not present

## 2023-03-29 DIAGNOSIS — K219 Gastro-esophageal reflux disease without esophagitis: Secondary | ICD-10-CM | POA: Diagnosis present

## 2023-03-29 DIAGNOSIS — Z3A36 36 weeks gestation of pregnancy: Secondary | ICD-10-CM

## 2023-03-29 DIAGNOSIS — O212 Late vomiting of pregnancy: Secondary | ICD-10-CM | POA: Diagnosis present

## 2023-03-29 DIAGNOSIS — O9962 Diseases of the digestive system complicating childbirth: Secondary | ICD-10-CM | POA: Diagnosis present

## 2023-03-29 DIAGNOSIS — O0990 Supervision of high risk pregnancy, unspecified, unspecified trimester: Secondary | ICD-10-CM

## 2023-03-29 DIAGNOSIS — Z3A37 37 weeks gestation of pregnancy: Secondary | ICD-10-CM | POA: Diagnosis not present

## 2023-03-29 DIAGNOSIS — O99284 Endocrine, nutritional and metabolic diseases complicating childbirth: Secondary | ICD-10-CM | POA: Diagnosis present

## 2023-03-29 DIAGNOSIS — O210 Mild hyperemesis gravidarum: Secondary | ICD-10-CM | POA: Diagnosis not present

## 2023-03-29 DIAGNOSIS — O9902 Anemia complicating childbirth: Secondary | ICD-10-CM | POA: Diagnosis present

## 2023-03-29 LAB — COMPREHENSIVE METABOLIC PANEL
ALT: 65 U/L — ABNORMAL HIGH (ref 0–44)
AST: 50 U/L — ABNORMAL HIGH (ref 15–41)
Albumin: 2.5 g/dL — ABNORMAL LOW (ref 3.5–5.0)
Alkaline Phosphatase: 107 U/L (ref 38–126)
Anion gap: 17 — ABNORMAL HIGH (ref 5–15)
BUN: <5 mg/dL — ABNORMAL LOW (ref 6–20)
CO2: 16 mmol/L — ABNORMAL LOW (ref 22–32)
Calcium: 8.4 mg/dL — ABNORMAL LOW (ref 8.9–10.3)
Chloride: 103 mmol/L (ref 98–111)
Creatinine, Ser: 0.78 mg/dL (ref 0.44–1.00)
GFR, Estimated: >60 mL/min (ref >60)
Glucose, Bld: 72 mg/dL (ref 70–99)
Potassium: 2.9 mmol/L — ABNORMAL LOW (ref 3.5–5.1)
Sodium: 136 mmol/L (ref 135–145)
Total Bilirubin: 3.1 mg/dL — ABNORMAL HIGH (ref 0.3–1.2)
Total Protein: 5.4 g/dL — ABNORMAL LOW (ref 6.5–8.1)

## 2023-03-29 MED ORDER — FAMOTIDINE 20 MG PO TABS: 20.0000 mg | ORAL_TABLET | Freq: Two times a day (BID) | ORAL | Status: DC

## 2023-03-29 MED ORDER — CAPSAICIN 0.025 % EX CREA
TOPICAL_CREAM | Freq: Two times a day (BID) | CUTANEOUS | Status: DC
  Filled 2023-03-29: qty 60

## 2023-03-29 MED ORDER — POTASSIUM CHLORIDE 10 MEQ/100ML IV SOLN
10.0000 meq | INTRAVENOUS | Status: AC
  Administered 2023-03-29 (×6): 10 meq via INTRAVENOUS
  Filled 2023-03-29 (×6): qty 100

## 2023-03-29 MED ORDER — SODIUM CHLORIDE 0.9 % IV SOLN: INTRAVENOUS | Status: DC | PRN

## 2023-03-29 MED ORDER — FAMOTIDINE IN NACL 20-0.9 MG/50ML-% IV SOLN
20.0000 mg | Freq: Two times a day (BID) | INTRAVENOUS | Status: DC
  Administered 2023-03-29: 20 mg via INTRAVENOUS
  Filled 2023-03-29 (×3): qty 50

## 2023-03-29 MED ORDER — POTASSIUM CHLORIDE CRYS ER 20 MEQ PO TBCR
40.0000 meq | EXTENDED_RELEASE_TABLET | Freq: Two times a day (BID) | ORAL | Status: AC
  Administered 2023-03-29: 40 meq via ORAL
  Filled 2023-03-29 (×3): qty 2

## 2023-03-29 MED ORDER — POTASSIUM CHLORIDE 10 MEQ/100ML IV SOLN
10.0000 meq | INTRAVENOUS | Status: AC
  Administered 2023-03-29 (×5): 10 meq via INTRAVENOUS
  Filled 2023-03-29 (×5): qty 100

## 2023-03-29 MED ORDER — HALOPERIDOL LACTATE 5 MG/ML IJ SOLN: 1.0000 mg | Freq: Four times a day (QID) | INTRAMUSCULAR | Status: DC | PRN

## 2023-03-29 NOTE — Progress Notes (Signed)
FACULTY PRACTICE ANTEPARTUM COMPREHENSIVE PROGRESS NOTE  Amy Singh is a 22 y.o. G3P1011 at [redacted]w[redacted]d who is admitted for N/V (possible THC induced) complicated by mild preeclampsia.  Estimated Date of Delivery: 04/21/23  Length of Stay:  0 Days. Admitted 03/28/2023  Subjective: She notes ongoing emesis. She still has the epigastric pain (she describes it as chest pain but points to inferior to her sternum.   Patient reports good fetal movement.  She reports no uterine contractions, no bleeding and no loss of fluid per vagina.  Vitals:  Blood pressure 133/74, pulse 99, temperature 97.9 F (36.6 C), temperature source Oral, resp. rate 18, height 5\' 2"  (1.575 m), weight 84.9 kg, last menstrual period 07/25/2022, SpO2 97 %, unknown if currently breastfeeding. Physical Examination: CONSTITUTIONAL: Well-developed, well-nourished female in no acute distress.  NEUROLOGIC: Alert and oriented to person, place, and time. No cranial nerve deficit noted. PSYCHIATRIC: Normal mood and affect. Normal behavior. Normal judgment and thought content. CARDIOVASCULAR: Normal heart rate noted, regular rhythm RESPIRATORY: Effort and breath sounds normal, no problems with respiration noted MUSCULOSKELETAL: Normal range of motion. No edema and no tenderness. 2+ distal pulses. ABDOMEN: Soft, nontender, nondistended, gravid. + epigastric tenderness CERVIX:  Not examined  Fetal monitoring: FHR: 150 bpm, Variability: moderate, Accelerations: Present, Decelerations: Absent  Uterine activity: no contractions  Results for orders placed or performed during the hospital encounter of 03/28/23 (from the past 48 hour(s))  Urinalysis, Routine w reflex microscopic -Urine, Clean Catch     Status: Abnormal   Collection Time: 03/28/23 11:20 AM  Result Value Ref Range   Color, Urine AMBER (A) YELLOW    Comment: BIOCHEMICALS MAY BE AFFECTED BY COLOR   APPearance HAZY (A) CLEAR   Specific Gravity, Urine 1.020 1.005 - 1.030   pH  6.0 5.0 - 8.0   Glucose, UA NEGATIVE NEGATIVE mg/dL   Hgb urine dipstick MODERATE (A) NEGATIVE   Bilirubin Urine NEGATIVE NEGATIVE   Ketones, ur 80 (A) NEGATIVE mg/dL   Protein, ur 161 (A) NEGATIVE mg/dL   Nitrite NEGATIVE NEGATIVE   Leukocytes,Ua SMALL (A) NEGATIVE   RBC / HPF >50 0 - 5 RBC/hpf   WBC, UA 21-50 0 - 5 WBC/hpf   Bacteria, UA FEW (A) NONE SEEN   Squamous Epithelial / HPF 21-50 0 - 5 /HPF   Mucus PRESENT     Comment: Performed at [facility], [address] [place], [place] [address]  Protein / creatinine ratio, urine     Status: Abnormal   Collection Time: 03/28/23 11:39 AM  Result Value Ref Range   Creatinine, Urine 212 mg/dL   Total Protein, Urine 111 mg/dL    Comment: NO NORMAL RANGE ESTABLISHED FOR THIS TEST   Protein Creatinine Ratio 0.52 (H) 0.00 - 0.15 mg/mg[Cre]    Comment: Performed at [facility], [address] [place], [place] [address]  Comprehensive metabolic panel     Status: Abnormal   Collection Time: 03/28/23 12:31 PM  Result Value Ref Range   Sodium 134 (L) 135 - 145 mmol/L   Potassium 2.5 (LL) 3.5 - 5.1 mmol/L    Comment: CRITICAL RESULT CALLED TO, READ BACK BY AND VERIFIED WITH F. MORRIS, RN @ 1357 03/28/23 BY SEKDAHL   Chloride 99 98 - 111 mmol/L   CO2 18 (L) 22 - 32 mmol/L   Glucose, Bld 71 70 - 99 mg/dL    Comment: Glucose reference range applies only to samples taken after fasting for at least  8 hours.   BUN <5 (L) 6 - 20 mg/dL   Creatinine, Ser 1.61 0.44 - 1.00 mg/dL   Calcium 9.4 8.9 - 09.6 mg/dL   Total Protein 7.2 6.5 - 8.1 g/dL   Albumin 3.3 (L) 3.5 - 5.0 g/dL   AST 51 (H) 15 - 41 U/L   ALT 67 (H) 0 - 44 U/L   Alkaline Phosphatase 147 (H) 38 - 126 U/L   Total Bilirubin 3.7 (H) 0.3 - 1.2 mg/dL   GFR, Estimated >04 >54 mL/min    Comment: (NOTE) Calculated using the CKD-EPI Creatinine Equation (2021)    Anion gap 17 (H) 5 - 15    Comment: Performed at [facility], [address] [place], [place] [address]   CBC     Status: Abnormal   Collection Time: 03/28/23 12:31 PM  Result Value Ref Range   WBC 12.3 (H) 4.0 - 10.5 K/uL   RBC 5.25 (H) 3.87 - 5.11 MIL/uL   Hemoglobin 14.3 12.0 - 15.0 g/dL   HCT 91.4 78.2 - 95.6 %   MCV 83.4 80.0 - 100.0 fL   MCH 27.2 26.0 - 34.0 pg   MCHC 32.6 30.0 - 36.0 g/dL   RDW 21.3 (H) 08.6 - 57.8 %   Platelets 254 150 - 400 K/uL   nRBC 0.0 0.0 - 0.2 %    Comment: Performed at [facility], [address], [place], [place] [address]  Type and screen [facility]     Status: None   Collection Time: 03/28/23  2:36 PM  Result Value Ref Range   ABO/RH(D) A POS    Antibody Screen NEG    Sample Expiration      03/31/2023,2359 Performed at [facility], [address] [place], [place] [address]   Basic metabolic panel     Status: Abnormal   Collection Time: 03/28/23  7:22 PM  Result Value Ref Range   Sodium 136 135 - 145 mmol/L   Potassium 2.7 (LL) 3.5 - 5.1 mmol/L    Comment: CRITICAL RESULT CALLED TO, READ BACK BY AND VERIFIED WITH J. BECKMAN RN 03/28/23 @2101  BY J. WHITE   Chloride 103 98 - 111 mmol/L   CO2 18 (L) 22 - 32 mmol/L   Glucose, Bld 63 (L) 70 - 99 mg/dL    Comment: Glucose reference range applies only to samples taken after fasting for at least 8 hours.   BUN <5 (L) 6 - 20 mg/dL   Creatinine, Ser 1.32 0.44 - 1.00 mg/dL   Calcium 9.0 8.9 - 44.0 mg/dL   GFR, Estimated >10 >27 mL/min    Comment: (NOTE) Calculated using the CKD-EPI Creatinine Equation (2021)    Anion gap 15 5 - 15    Comment: Performed at [facility], [address], [place], [place] [address]  Comprehensive metabolic panel     Status: Abnormal   Collection Time: 03/29/23  7:58 AM  Result Value Ref Range   Sodium 136 135 - 145 mmol/L   Potassium 2.9 (L) 3.5 - 5.1 mmol/L   Chloride 103 98 - 111 mmol/L   CO2 16 (L) 22 - 32 mmol/L   Glucose, Bld 72 70 - 99 mg/dL    Comment: Glucose reference range applies only to samples taken after fasting for  at least 8 hours.   BUN <5 (L) 6 - 20 mg/dL   Creatinine, Ser 4.40 0.44 - 1.00 mg/dL   Calcium  8.4 (L) 8.9 - 10.3 mg/dL   Total Protein 5.4 (L) 6.5 - 8.1 g/dL   Albumin 2.5 (L) 3.5 - 5.0 g/dL   AST 50 (H) 15 - 41 U/L   ALT 65 (H) 0 - 44 U/L   Alkaline Phosphatase 107 38 - 126 U/L   Total Bilirubin 3.1 (H) 0.3 - 1.2 mg/dL   GFR, Estimated >78 >29 mL/min    Comment: (NOTE) Calculated using the CKD-EPI Creatinine Equation (2021)    Anion gap 17 (H) 5 - 15    Comment: Performed at [facility], [address], [place], [place] [address]    No results found.  Current scheduled medications  aspirin EC  81 mg Oral Daily   capsaicin   Topical BID   docusate sodium  100 mg Oral Daily   metoCLOPramide  10 mg Oral Q6H   Or   metoCLOPramide (REGLAN) injection  10 mg Intravenous Q6H   pantoprazole  40 mg Oral Daily   potassium chloride  40 mEq Oral BID   prenatal multivitamin  1 tablet Oral Q1200    I have reviewed the patient's current medications.  ASSESSMENT: Active Problems:   Hypokalemia due to excessive gastrointestinal loss of potassium   PLAN: N/V of pregnancy - Will add in Pepcid to help with epigastric pain. No suspicion for cardiac etiology given pain is reproducible on exam and has been present since prior to admission.  Has tums and omeprazole.  - Will try Haldol as likely THC induced and because still having emesis.   Hypokalemia - Improving. Will check Mag level tomorrow. Will replete K today again.   Elevated LFTs - Stable and suspected due to emesis.   Mild Preeclampsia - Bps normal overall but some mild range blood pressures.  - Diagnosis based on BP and P/C ratio >300.  - Discussed induction at 37w. Will schedule. May need inpatient until that time.   Continue routine antenatal care.   Milas Hock, MD, FACOG Obstetrician & Gynecologist, [facility] for [facility], [facility]

## 2023-03-30 DIAGNOSIS — O1413 Severe pre-eclampsia, third trimester: Secondary | ICD-10-CM

## 2023-03-30 DIAGNOSIS — Z3A36 36 weeks gestation of pregnancy: Secondary | ICD-10-CM

## 2023-03-30 LAB — MAGNESIUM
Magnesium: 1.6 mg/dL — ABNORMAL LOW (ref 1.7–2.4)
Magnesium: 4.7 mg/dL — ABNORMAL HIGH (ref 1.7–2.4)

## 2023-03-30 LAB — COMPREHENSIVE METABOLIC PANEL
ALT: 66 U/L — ABNORMAL HIGH (ref 0–44)
ALT: 80 U/L — ABNORMAL HIGH (ref 0–44)
AST: 53 U/L — ABNORMAL HIGH (ref 15–41)
AST: 59 U/L — ABNORMAL HIGH (ref 15–41)
Albumin: 2.2 g/dL — ABNORMAL LOW (ref 3.5–5.0)
Albumin: 2.5 g/dL — ABNORMAL LOW (ref 3.5–5.0)
Alkaline Phosphatase: 98 U/L (ref 38–126)
Alkaline Phosphatase: 126 U/L (ref 38–126)
Anion gap: 6 (ref 5–15)
Anion gap: 9 (ref 5–15)
BUN: <5 mg/dL — ABNORMAL LOW (ref 6–20)
BUN: <5 mg/dL — ABNORMAL LOW (ref 6–20)
CO2: 20 mmol/L — ABNORMAL LOW (ref 22–32)
CO2: 19 mmol/L — ABNORMAL LOW (ref 22–32)
Calcium: 8.2 mg/dL — ABNORMAL LOW (ref 8.9–10.3)
Calcium: 7.6 mg/dL — ABNORMAL LOW (ref 8.9–10.3)
Chloride: 110 mmol/L (ref 98–111)
Chloride: 110 mmol/L (ref 98–111)
Creatinine, Ser: 0.69 mg/dL (ref 0.44–1.00)
Creatinine, Ser: 0.58 mg/dL (ref 0.44–1.00)
GFR, Estimated: >60 mL/min (ref >60)
GFR, Estimated: >60 mL/min (ref >60)
Glucose, Bld: 112 mg/dL — ABNORMAL HIGH (ref 70–99)
Glucose, Bld: 81 mg/dL (ref 70–99)
Potassium: 2.7 mmol/L — CL (ref 3.5–5.1)
Potassium: 3.2 mmol/L — ABNORMAL LOW (ref 3.5–5.1)
Sodium: 136 mmol/L (ref 135–145)
Sodium: 138 mmol/L (ref 135–145)
Total Bilirubin: 2.7 mg/dL — ABNORMAL HIGH (ref 0.3–1.2)
Total Bilirubin: 2.1 mg/dL — ABNORMAL HIGH (ref 0.3–1.2)
Total Protein: 4.9 g/dL — ABNORMAL LOW (ref 6.5–8.1)
Total Protein: 5.4 g/dL — ABNORMAL LOW (ref 6.5–8.1)

## 2023-03-30 LAB — CULTURE, OB URINE

## 2023-03-30 LAB — CBC
HCT: 34.9 % — ABNORMAL LOW (ref 36.0–46.0)
Hemoglobin: 11.3 g/dL — ABNORMAL LOW (ref 12.0–15.0)
MCH: 26.7 pg (ref 26.0–34.0)
MCHC: 32.4 g/dL (ref 30.0–36.0)
MCV: 82.5 fL (ref 80.0–100.0)
Platelets: 204 10*3/uL (ref 150–400)
RBC: 4.23 MIL/uL (ref 3.87–5.11)
RDW: 17.3 % — ABNORMAL HIGH (ref 11.5–15.5)
WBC: 10.1 10*3/uL (ref 4.0–10.5)
nRBC: 0 % (ref 0.0–0.2)

## 2023-03-30 LAB — GC/CHLAMYDIA PROBE AMP (~~LOC~~) NOT AT ARMC
Chlamydia: NEGATIVE
Comment: NEGATIVE
Comment: NORMAL
Neisseria Gonorrhea: NEGATIVE

## 2023-03-30 MED ORDER — POTASSIUM CHLORIDE 2 MEQ/ML IV SOLN
INTRAVENOUS | Status: DC
  Filled 2023-03-30: qty 1000

## 2023-03-30 MED ORDER — FENTANYL CITRATE (PF) 100 MCG/2ML IJ SOLN
50.0000 ug | INTRAMUSCULAR | Status: AC | PRN
  Administered 2023-03-31 (×2): 50 ug via INTRAVENOUS
  Filled 2023-03-30 (×2): qty 2

## 2023-03-30 MED ORDER — DOCUSATE SODIUM 100 MG PO CAPS: 100.0000 mg | ORAL_CAPSULE | Freq: Two times a day (BID) | ORAL | Status: DC | PRN

## 2023-03-30 MED ORDER — HYDRALAZINE HCL 20 MG/ML IJ SOLN: 10.0000 mg | INTRAMUSCULAR | Status: DC | PRN

## 2023-03-30 MED ORDER — POTASSIUM CHLORIDE 2 MEQ/ML IV SOLN
INTRAVENOUS | Status: AC
  Filled 2023-03-30: qty 1000

## 2023-03-30 MED ORDER — LABETALOL HCL 5 MG/ML IV SOLN: 80.0000 mg | INTRAVENOUS | Status: DC | PRN

## 2023-03-30 MED ORDER — MISOPROSTOL 50MCG HALF TABLET
50.0000 ug | ORAL_TABLET | Freq: Once | ORAL | Status: AC
  Administered 2023-03-30: 50 ug via ORAL
  Filled 2023-03-30: qty 1

## 2023-03-30 MED ORDER — POTASSIUM CHLORIDE 10 MEQ/100ML IV SOLN
10.0000 meq | INTRAVENOUS | Status: AC
  Administered 2023-03-30 (×2): 10 meq via INTRAVENOUS
  Filled 2023-03-30: qty 100

## 2023-03-30 MED ORDER — MAGNESIUM SULFATE BOLUS VIA INFUSION
4.0000 g | Freq: Once | INTRAVENOUS | Status: AC
  Administered 2023-03-30: 4 g via INTRAVENOUS
  Filled 2023-03-30: qty 1000

## 2023-03-30 MED ORDER — MAGNESIUM SULFATE 4 GM/100ML IV SOLN
4.0000 g | Freq: Once | INTRAVENOUS | Status: AC
  Administered 2023-03-30: 4 g via INTRAVENOUS
  Filled 2023-03-30: qty 100

## 2023-03-30 MED ORDER — LABETALOL HCL 5 MG/ML IV SOLN: 20.0000 mg | INTRAVENOUS | Status: DC | PRN

## 2023-03-30 MED ORDER — POTASSIUM CHLORIDE 10 MEQ/100ML IV SOLN
10.0000 meq | INTRAVENOUS | Status: DC
  Filled 2023-03-30: qty 100

## 2023-03-30 MED ORDER — LABETALOL HCL 5 MG/ML IV SOLN: 40.0000 mg | INTRAVENOUS | Status: DC | PRN

## 2023-03-30 MED ORDER — MAGNESIUM SULFATE 40 GM/1000ML IV SOLN
2.0000 g/h | INTRAVENOUS | Status: DC
  Administered 2023-03-30 – 2023-03-31 (×2): 2 g/h via INTRAVENOUS
  Filled 2023-03-30 (×2): qty 1000

## 2023-03-30 MED ORDER — SODIUM CHLORIDE 0.9 % IV SOLN: 5.0000 10*6.[IU] | Freq: Once | INTRAVENOUS | Status: DC

## 2023-03-30 MED ORDER — POTASSIUM CHLORIDE 10 MEQ/100ML IV SOLN
10.0000 meq | INTRAVENOUS | Status: AC
  Administered 2023-03-30 – 2023-03-31 (×4): 10 meq via INTRAVENOUS
  Filled 2023-03-30 (×4): qty 100

## 2023-03-30 MED ORDER — TERBUTALINE SULFATE 1 MG/ML IJ SOLN: 0.2500 mg | Freq: Once | INTRAMUSCULAR | Status: DC | PRN

## 2023-03-30 MED ORDER — PENICILLIN G POT IN DEXTROSE 60000 UNIT/ML IV SOLN: 3.0000 10*6.[IU] | INTRAVENOUS | Status: DC

## 2023-03-30 MED ORDER — MISOPROSTOL 25 MCG QUARTER TABLET
25.0000 ug | ORAL_TABLET | Freq: Once | ORAL | Status: AC
  Administered 2023-03-30: 25 ug via VAGINAL
  Filled 2023-03-30: qty 1

## 2023-03-30 NOTE — Progress Notes (Signed)
Daily Antepartum Note  Admission Date: 03/28/2023 Current Date: 03/30/2023 9:02 AM  Amy Singh is a 22 y.o. G3P1011 at [redacted]w[redacted]d, HD#3, admitted for LFTs, RUQ pain in the setting of mild pre-eclampsia diagnosed on 7/5; pt also with acute on chronic hypokalemia with chronic n/v of pregnancy.  Pregnancy complicated by: Patient Active Problem List   Diagnosis Date Noted   Hypokalemia due to excessive gastrointestinal loss of potassium 03/28/2023   Mild pre-eclampsia in third trimester 03/23/2023   Chlamydia infection affecting pregnancy in first trimester 10/14/2022   Short interval between pregnancies affecting pregnancy, antepartum 10/12/2022   Supervision of high risk pregnancy, antepartum 10/12/2022   History of severe pre-eclampsia 09/21/2021   Anemia affecting pregnancy in third trimester 07/08/2021   Gonorrhea affecting pregnancy in first trimester 03/16/2021   IUFD at less than 20 weeks of gestation 11/18/2020   History of marijuana use 10/20/2020    Overnight/24hr events:  none  Subjective:  Patient currently denies any n/v; she's only had juice this morning. She notes epigastric discomfort only when she eats. She denies any neuro s/s and no labor s/s or decreased fetal movement.   Objective:    Current Vital Signs 24h Vital Sign Ranges  T 97.9 F (36.6 C) Temp  Avg: 98.2 F (36.8 C)  Min: 97.7 F (36.5 C)  Max: 98.8 F (37.1 C)  BP 126/75 BP  Min: 111/69  Max: 137/87  HR 91 Pulse  Avg: 96.8  Min: 91  Max: 104  RR 20 Resp  Avg: 18.4  Min: 18  Max: 20  SaO2 100 %  (room air) SpO2  Avg: 99.4 %  Min: 98 %  Max: 100 %       24 Hour I/O Current Shift I/O  Time Ins Outs 07/04 0701 - 07/05 0700 In: 1618.3 [P.O.:712; I.V.:756.3] Out: 2150 [Urine:2150] No intake/output data recorded.   Patient Vitals for the past 24 hrs:  BP Temp Temp src Pulse Resp SpO2 Weight  03/30/23 0852 126/75 97.9 F (36.6 C) Oral 91 20 100 % --  03/30/23 0845 -- -- -- -- -- -- 87.7 kg  03/30/23  0430 111/69 97.7 F (36.5 C) Oral 94 18 99 % --  03/30/23 0427 -- -- -- -- -- -- 87.5 kg  03/29/23 1930 137/87 98.5 F (36.9 C) Oral (!) 104 18 100 % --  03/29/23 1458 135/78 97.9 F (36.6 C) Oral (!) 102 18 100 % --  03/29/23 1127 123/77 98.8 F (37.1 C) Oral 93 18 98 % --   Fetal Heart Tones: 140 baseline, +accels, no decel, mod variability last night Tocometry: q58m  Physical exam: General: Well nourished, well developed female in no acute distress. Abdomen: gravid nttp Cardiovascular: S1, S2 normal, no murmur, rub or gallop, regular rate and rhythm Respiratory: CTAB Extremities: no clubbing, cyanosis or edema Skin: Warm and dry.   Medications: Current Facility-Administered Medications  Medication Dose Route Frequency Provider Last Rate Last Admin   0.9 %  sodium chloride infusion   Intravenous PRN Milas Hock, MD 10 mL/hr at 03/29/23 1154 New Bag at 03/29/23 1154   acetaminophen (TYLENOL) tablet 650 mg  650 mg Oral Q4H PRN Warden Fillers, MD       calcium carbonate (TUMS - dosed in mg elemental calcium) chewable tablet 400 mg of elemental calcium  2 tablet Oral Q4H PRN Warden Fillers, MD       capsaicin (ZOSTRIX) 0.025 % cream   Topical BID Milas Hock, MD  Given at 03/30/23 0435   docusate sodium (COLACE) capsule 100 mg  100 mg Oral BID PRN [place] Bing, MD       famotidine (PEPCID) IVPB 20 mg premix  20 mg Intravenous Q12H Milas Hock, MD 100 mL/hr at 03/29/23 2223 20 mg at 03/29/23 2223   haloperidol lactate (HALDOL) injection 1 mg  1 mg Intravenous Q6H PRN Milas Hock, MD       labetalol (NORMODYNE) injection 20 mg  20 mg Intravenous PRN [place] Bing, MD       And   labetalol (NORMODYNE) injection 40 mg  40 mg Intravenous PRN Hilda Bing, MD       And   labetalol (NORMODYNE) injection 80 mg  80 mg Intravenous PRN [place] Bing, MD       And   hydrALAZINE (APRESOLINE) injection 10 mg  10 mg Intravenous PRN [place] Bing, MD        hydrOXYzine (ATARAX) tablet 50 mg  50 mg Oral Q6H PRN Warden Fillers, MD       Or   hydrOXYzine (VISTARIL) injection 50 mg  50 mg Intramuscular Q6H PRN Warden Fillers, MD       lactated ringers 1,000 mL with potassium chloride 40 mEq infusion   Intravenous Continuous [place] Bing, MD       magnesium bolus via infusion 4 g  4 g Intravenous Once [place] Bing, MD       magnesium sulfate 40 grams in SWI 1000 mL OB infusion  2 g/hr Intravenous Continuous  Bing, MD       magnesium sulfate IVPB 4 g 100 mL  4 g Intravenous Once Milas Hock, MD 50 mL/hr at 03/30/23 0807 4 g at 03/30/23 0807   metoCLOPramide (REGLAN) tablet 10 mg  10 mg Oral Q6H Warden Fillers, MD       Or   metoCLOPramide (REGLAN) injection 10 mg  10 mg Intravenous Q6H Warden Fillers, MD   10 mg at 03/30/23 0800   ondansetron (ZOFRAN-ODT) disintegrating tablet 4-8 mg  4-8 mg Oral Q8H PRN Warden Fillers, MD       Or   ondansetron [facility]) injection 4 mg  4 mg Intravenous Q8H PRN Warden Fillers, MD   4 mg at 03/29/23 0154   Or   ondansetron (ZOFRAN) 8 mg in sodium chloride 0.9 % 50 mL IVPB  8 mg Intravenous Q8H PRN Warden Fillers, MD       pantoprazole (PROTONIX) EC tablet 40 mg  40 mg Oral Daily Mariel Aloe A, MD       potassium chloride 10 mEq in 100 mL IVPB  10 mEq Intravenous Q1 Hr x 2 Falen Lehrmann, MD       potassium chloride SA (KLOR-CON M) CR tablet 40 mEq  40 mEq Oral BID Reva Bores, MD   40 mEq at 03/29/23 0121   promethazine (PHENERGAN) tablet 12.5-25 mg  12.5-25 mg Oral Q4H PRN Warden Fillers, MD       Or   promethazine (PHENERGAN) suppository 12.5-25 mg  12.5-25 mg Rectal Q4H PRN Warden Fillers, MD        Labs:  Recent Labs  Lab 03/28/23 1231  WBC 12.3*  HGB 14.3  HCT 43.8  PLT 254    Recent Labs  Lab 03/28/23 1231 03/28/23 1922 03/29/23 0758 03/30/23 0423  NA 134* 136 136 136  K 2.5* 2.7* 2.9* 2.7*  CL 99 103 103 110  CO2 18*  18* 16* 20*  BUN <5* <5* <5*  <5*  CREATININE 0.91 0.82 0.78 0.69  CALCIUM 9.4 9.0 8.4* 8.2*  PROT 7.2  --  5.4* 4.9*  BILITOT 3.7*  --  3.1* 2.7*  ALKPHOS 147*  --  107 98  ALT 67*  --  65* 66*  AST 51*  --  50* 53*  GLUCOSE 71 63* 72 112*    Radiology:  Bedside u/s: cephalic, subjectively normal AFI, normal FHR, +FM  Assessment & Plan:  Patient stable *Pregnancy: f/u EFM for this morning. GC/CT pending *Transaminitis, RUQ in the setting of mild pre-eclampsia: patient diagnosed with mild pre-eclampsia a week ago with consistently mild range BPs with new PC ratio in the 500s; cbc and cmp wnl. When admitted on 7/3, PC ratio stable but with new elevated LFTs. Given this, I told her I recommend calling her severe pre-eclampsia, starting Mg and going ahead with IOL today, which she is amenable to -repeat labs at 1700, along with Mg level *Preterm: GBS pending. Will start abx. No need for BMZ *FEN/GI: KCL 10 x 2 ordered and I added KCL 40 to her LR MIVF at 13mL/hr. She doesn't have any n/v this morning so will see if she can take her kdur.  -continue clears. Repeat labs at 1700.  -if ruq pain persists pp, consider ruq u/s.  *Dispo: to L&D for IOL  Cornelia Copa MD Attending Center for [facility]) GYN Consult Phone: [phone] (M-F, 0800-1700) & [phone]  (Off hours, weekends, holidays)

## 2023-03-30 NOTE — Progress Notes (Signed)
LABOR PROGRESS NOTE  Amy Singh is a 22 y.o. G3P1011 at [redacted]w[redacted]d  admitted for IOL for preeclampsia with severe features, hypokalemia with chronic nausea and vomiting in pregnancy.  Subjective: Doing well right now.  Comfortable.  No concerns.  Objective: BP 133/84 (BP Location: Left Arm)   Pulse 96   Temp 97.6 F (36.4 C) (Oral)   Resp 18   Ht 5\' 2"  (1.575 m)   Wt 87.7 kg   LMP 07/25/2022 (Approximate)   SpO2 100%   BMI 35.37 kg/m  or  Vitals:   03/30/23 1450 03/30/23 1548 03/30/23 1649 03/30/23 1744  BP: 121/69 127/79 (!) 136/90 133/84  Pulse: 88 85 89 96  Resp: 17 18 17 18   Temp:   97.6 F (36.4 C)   TempSrc:   Oral   SpO2: 100% 100% 100% 100%  Weight:      Height:        Dilation: 1.5 Effacement (%): 70 Station: -3 Presentation: Vertex (ultrasound) Exam by:: Mikahla Wisor FHT: baseline rate 140, moderate varibility, + acel, no decel Toco: Infrequent  Labs: Lab Results  Component Value Date   WBC 10.1 03/30/2023   HGB 11.3 (L) 03/30/2023   HCT 34.9 (L) 03/30/2023   MCV 82.5 03/30/2023   PLT 204 03/30/2023    Patient Active Problem List   Diagnosis Date Noted   Hypokalemia due to excessive gastrointestinal loss of potassium 03/28/2023   Mild pre-eclampsia in third trimester 03/23/2023   Chlamydia infection affecting pregnancy in first trimester 10/14/2022   Short interval between pregnancies affecting pregnancy, antepartum 10/12/2022   Supervision of high risk pregnancy, antepartum 10/12/2022   History of severe pre-eclampsia 09/21/2021   Anemia affecting pregnancy in third trimester 07/08/2021   Gonorrhea affecting pregnancy in first trimester 03/16/2021   IUFD at less than 20 weeks of gestation 11/18/2020   History of marijuana use 10/20/2020    Assessment / Plan: 22 y.o. G3P1011 at [redacted]w[redacted]d here for IOL for preeclampsia with severe features, hypokalemia.  Labor: Induction started with Foley balloon and Cytotec 50/25 Fetal Wellbeing: Category 1,  reassuring ID: GBS unknown, GBS culture pending.  Will treat with penicillin. Pain Control: Per patient request Anticipated MOD: Vaginal delivery Preeclampsia with severe features: Continuing on magnesium CMP every 12 hours    Derrel Nip, MD  OB Fellow  03/30/2023, 7:21 PM

## 2023-03-31 ENCOUNTER — Inpatient Hospital Stay (HOSPITAL_COMMUNITY): Payer: BC Managed Care – PPO | Attending: Obstetrics and Gynecology

## 2023-03-31 ENCOUNTER — Inpatient Hospital Stay (HOSPITAL_COMMUNITY)
Admission: RE | Admit: 2023-03-31 | Payer: BC Managed Care – PPO | Source: Home / Self Care | Admitting: Obstetrics and Gynecology

## 2023-03-31 ENCOUNTER — Encounter (HOSPITAL_COMMUNITY): Payer: Self-pay | Admitting: Family Medicine

## 2023-03-31 DIAGNOSIS — O1414 Severe pre-eclampsia complicating childbirth: Secondary | ICD-10-CM

## 2023-03-31 DIAGNOSIS — O99324 Drug use complicating childbirth: Secondary | ICD-10-CM

## 2023-03-31 DIAGNOSIS — Z3A36 36 weeks gestation of pregnancy: Secondary | ICD-10-CM

## 2023-03-31 DIAGNOSIS — O210 Mild hyperemesis gravidarum: Secondary | ICD-10-CM

## 2023-03-31 DIAGNOSIS — O09292 Supervision of pregnancy with other poor reproductive or obstetric history, second trimester: Secondary | ICD-10-CM

## 2023-03-31 LAB — CBC WITH DIFFERENTIAL/PLATELET
Abs Immature Granulocytes: 0 10*3/uL (ref 0.00–0.07)
Basophils Absolute: 0 10*3/uL (ref 0.0–0.1)
Basophils Relative: 0 %
Eosinophils Absolute: 0.4 10*3/uL (ref 0.0–0.5)
Eosinophils Relative: 4 %
HCT: 36 % (ref 36.0–46.0)
Hemoglobin: 12 g/dL (ref 12.0–15.0)
Lymphocytes Relative: 23 %
Lymphs Abs: 2.5 10*3/uL (ref 0.7–4.0)
MCH: 27.5 pg (ref 26.0–34.0)
MCHC: 33.3 g/dL (ref 30.0–36.0)
MCV: 82.4 fL (ref 80.0–100.0)
Monocytes Absolute: 1 10*3/uL (ref 0.1–1.0)
Monocytes Relative: 9 %
Neutro Abs: 7 10*3/uL (ref 1.7–7.7)
Neutrophils Relative %: 64 %
Platelets: 189 10*3/uL (ref 150–400)
RBC: 4.37 MIL/uL (ref 3.87–5.11)
RDW: 17.2 % — ABNORMAL HIGH (ref 11.5–15.5)
WBC: 11 10*3/uL — ABNORMAL HIGH (ref 4.0–10.5)
nRBC: 0 % (ref 0.0–0.2)
nRBC: 0 /100 WBC

## 2023-03-31 LAB — CBC
HCT: 38.1 % (ref 36.0–46.0)
Hemoglobin: 12.4 g/dL (ref 12.0–15.0)
MCH: 27.6 pg (ref 26.0–34.0)
MCHC: 32.5 g/dL (ref 30.0–36.0)
MCV: 84.7 fL (ref 80.0–100.0)
Platelets: 184 10*3/uL (ref 150–400)
RBC: 4.5 MIL/uL (ref 3.87–5.11)
RDW: 17.3 % — ABNORMAL HIGH (ref 11.5–15.5)
WBC: 12 10*3/uL — ABNORMAL HIGH (ref 4.0–10.5)
nRBC: 0 % (ref 0.0–0.2)

## 2023-03-31 LAB — COMPREHENSIVE METABOLIC PANEL
ALT: 97 U/L — ABNORMAL HIGH (ref 0–44)
ALT: 102 U/L — ABNORMAL HIGH (ref 0–44)
AST: 74 U/L — ABNORMAL HIGH (ref 15–41)
AST: 75 U/L — ABNORMAL HIGH (ref 15–41)
Albumin: 2.8 g/dL — ABNORMAL LOW (ref 3.5–5.0)
Albumin: 2.8 g/dL — ABNORMAL LOW (ref 3.5–5.0)
Alkaline Phosphatase: 137 U/L — ABNORMAL HIGH (ref 38–126)
Alkaline Phosphatase: 136 U/L — ABNORMAL HIGH (ref 38–126)
Anion gap: 9 (ref 5–15)
Anion gap: 13 (ref 5–15)
BUN: <5 mg/dL — ABNORMAL LOW (ref 6–20)
BUN: <5 mg/dL — ABNORMAL LOW (ref 6–20)
CO2: 19 mmol/L — ABNORMAL LOW (ref 22–32)
CO2: 19 mmol/L — ABNORMAL LOW (ref 22–32)
Calcium: 7.4 mg/dL — ABNORMAL LOW (ref 8.9–10.3)
Calcium: 7.4 mg/dL — ABNORMAL LOW (ref 8.9–10.3)
Chloride: 108 mmol/L (ref 98–111)
Chloride: 105 mmol/L (ref 98–111)
Creatinine, Ser: 0.67 mg/dL (ref 0.44–1.00)
Creatinine, Ser: 0.45 mg/dL (ref 0.44–1.00)
GFR, Estimated: >60 mL/min (ref >60)
GFR, Estimated: >60 mL/min (ref >60)
Glucose, Bld: 101 mg/dL — ABNORMAL HIGH (ref 70–99)
Glucose, Bld: 104 mg/dL — ABNORMAL HIGH (ref 70–99)
Potassium: 3.3 mmol/L — ABNORMAL LOW (ref 3.5–5.1)
Potassium: 3.1 mmol/L — ABNORMAL LOW (ref 3.5–5.1)
Sodium: 136 mmol/L (ref 135–145)
Sodium: 137 mmol/L (ref 135–145)
Total Bilirubin: 2 mg/dL — ABNORMAL HIGH (ref 0.3–1.2)
Total Bilirubin: 1.7 mg/dL — ABNORMAL HIGH (ref 0.3–1.2)
Total Protein: 5.9 g/dL — ABNORMAL LOW (ref 6.5–8.1)
Total Protein: 5.9 g/dL — ABNORMAL LOW (ref 6.5–8.1)

## 2023-03-31 LAB — MAGNESIUM: Magnesium: 4.8 mg/dL — ABNORMAL HIGH (ref 1.7–2.4)

## 2023-03-31 LAB — TYPE AND SCREEN
ABO/RH(D): A POS
Antibody Screen: NEGATIVE

## 2023-03-31 LAB — CULTURE, BETA STREP (GROUP B ONLY)

## 2023-03-31 LAB — RPR: RPR Ser Ql: NONREACTIVE

## 2023-03-31 MED ORDER — LACTATED RINGERS IV SOLN
INTRAVENOUS | Status: DC
Start: 2023-03-31 — End: 2023-03-31

## 2023-03-31 MED ORDER — ONDANSETRON HCL 4 MG/2ML IJ SOLN: 4.0000 mg | INTRAMUSCULAR | Status: DC | PRN

## 2023-03-31 MED ORDER — IBUPROFEN 600 MG PO TABS
600.0000 mg | ORAL_TABLET | Freq: Four times a day (QID) | ORAL | Status: DC
  Administered 2023-03-31 – 2023-04-02 (×6): 600 mg via ORAL
  Filled 2023-03-31 (×8): qty 1

## 2023-03-31 MED ORDER — LIDOCAINE HCL (PF) 1 % IJ SOLN: 30.0000 mL | INTRAMUSCULAR | Status: DC | PRN

## 2023-03-31 MED ORDER — DIPHENHYDRAMINE HCL 25 MG PO CAPS: 25.0000 mg | ORAL_CAPSULE | Freq: Four times a day (QID) | ORAL | Status: DC | PRN

## 2023-03-31 MED ORDER — BENZOCAINE-MENTHOL 20-0.5 % EX AERO
1.0000 | INHALATION_SPRAY | CUTANEOUS | Status: DC | PRN
  Administered 2023-03-31: 1 via TOPICAL
  Filled 2023-03-31: qty 56

## 2023-03-31 MED ORDER — OXYCODONE-ACETAMINOPHEN 5-325 MG PO TABS: 1.0000 | ORAL_TABLET | ORAL | Status: DC | PRN

## 2023-03-31 MED ORDER — LACTATED RINGERS IV SOLN: INTRAVENOUS | Status: DC

## 2023-03-31 MED ORDER — PENICILLIN G POT IN DEXTROSE 60000 UNIT/ML IV SOLN
3.0000 10*6.[IU] | INTRAVENOUS | Status: DC
Start: 2023-03-31 — End: 2023-03-31

## 2023-03-31 MED ORDER — DIBUCAINE (PERIANAL) 1 % EX OINT: 1.0000 | TOPICAL_OINTMENT | CUTANEOUS | Status: DC | PRN

## 2023-03-31 MED ORDER — OXYTOCIN-SODIUM CHLORIDE 30-0.9 UT/500ML-% IV SOLN
2.5000 [IU]/h | INTRAVENOUS | Status: DC
  Administered 2023-03-31: 2.5 [IU]/h via INTRAVENOUS

## 2023-03-31 MED ORDER — MAGNESIUM SULFATE 40 GM/1000ML IV SOLN
2.0000 g/h | INTRAVENOUS | Status: DC
  Administered 2023-03-31: 2 g/h via INTRAVENOUS
  Filled 2023-03-31: qty 1000

## 2023-03-31 MED ORDER — ZOLPIDEM TARTRATE 5 MG PO TABS: 5.0000 mg | ORAL_TABLET | Freq: Every evening | ORAL | Status: DC | PRN

## 2023-03-31 MED ORDER — ONDANSETRON HCL 4 MG PO TABS: 4.0000 mg | ORAL_TABLET | ORAL | Status: DC | PRN

## 2023-03-31 MED ORDER — TRANEXAMIC ACID-NACL 1000-0.7 MG/100ML-% IV SOLN
1000.0000 mg | INTRAVENOUS | Status: AC
  Administered 2023-03-31: 1000 mg via INTRAVENOUS

## 2023-03-31 MED ORDER — OXYTOCIN BOLUS FROM INFUSION
333.0000 mL | Freq: Once | INTRAVENOUS | Status: AC
  Administered 2023-03-31: 333 mL via INTRAVENOUS

## 2023-03-31 MED ORDER — OXYTOCIN-SODIUM CHLORIDE 30-0.9 UT/500ML-% IV SOLN
1.0000 m[IU]/min | INTRAVENOUS | Status: DC
  Administered 2023-03-31: 2 m[IU]/min via INTRAVENOUS
  Filled 2023-03-31: qty 500

## 2023-03-31 MED ORDER — PRENATAL MULTIVITAMIN CH
1.0000 | ORAL_TABLET | Freq: Every day | ORAL | Status: DC
  Administered 2023-03-31: 1 via ORAL
  Filled 2023-03-31: qty 1

## 2023-03-31 MED ORDER — SIMETHICONE 80 MG PO CHEW: 80.0000 mg | CHEWABLE_TABLET | ORAL | Status: DC | PRN

## 2023-03-31 MED ORDER — TETANUS-DIPHTH-ACELL PERTUSSIS 5-2.5-18.5 LF-MCG/0.5 IM SUSY: 0.5000 mL | PREFILLED_SYRINGE | Freq: Once | INTRAMUSCULAR | Status: DC

## 2023-03-31 MED ORDER — LACTATED RINGERS IV SOLN
500.0000 mL | INTRAVENOUS | Status: DC | PRN
  Administered 2023-03-31: 1000 mL via INTRAVENOUS

## 2023-03-31 MED ORDER — ONDANSETRON HCL 4 MG/2ML IJ SOLN: 4.0000 mg | Freq: Four times a day (QID) | INTRAMUSCULAR | Status: DC | PRN

## 2023-03-31 MED ORDER — SODIUM CHLORIDE 0.9 % IV SOLN: 5.0000 10*6.[IU] | Freq: Once | INTRAVENOUS | Status: DC

## 2023-03-31 MED ORDER — ACETAMINOPHEN 325 MG PO TABS: 650.0000 mg | ORAL_TABLET | ORAL | Status: DC | PRN

## 2023-03-31 MED ORDER — SENNOSIDES-DOCUSATE SODIUM 8.6-50 MG PO TABS
2.0000 | ORAL_TABLET | Freq: Every day | ORAL | Status: DC
  Filled 2023-03-31 (×2): qty 2

## 2023-03-31 MED ORDER — WITCH HAZEL-GLYCERIN EX PADS: 1.0000 | MEDICATED_PAD | CUTANEOUS | Status: DC | PRN

## 2023-03-31 MED ORDER — POTASSIUM CHLORIDE 2 MEQ/ML IV SOLN
INTRAVENOUS | Status: DC
  Filled 2023-03-31 (×3): qty 1000

## 2023-03-31 MED ORDER — COCONUT OIL OIL: 1.0000 | TOPICAL_OIL | Status: DC | PRN

## 2023-03-31 MED ORDER — FENTANYL CITRATE (PF) 100 MCG/2ML IJ SOLN
INTRAMUSCULAR | Status: AC
  Filled 2023-03-31: qty 2

## 2023-03-31 MED ORDER — POTASSIUM CHLORIDE CRYS ER 20 MEQ PO TBCR
20.0000 meq | EXTENDED_RELEASE_TABLET | Freq: Two times a day (BID) | ORAL | Status: DC
  Filled 2023-03-31 (×2): qty 1

## 2023-03-31 MED ORDER — FENTANYL CITRATE (PF) 100 MCG/2ML IJ SOLN
100.0000 ug | Freq: Once | INTRAMUSCULAR | Status: AC
  Administered 2023-03-31: 100 ug via INTRAVENOUS

## 2023-03-31 MED ORDER — FUROSEMIDE 20 MG PO TABS
20.0000 mg | ORAL_TABLET | Freq: Every day | ORAL | Status: DC
  Administered 2023-03-31: 20 mg via ORAL
  Filled 2023-03-31: qty 1

## 2023-03-31 NOTE — Discharge Summary (Signed)
Postpartum Discharge Summary    Patient Name: Amy Singh DOB: 2001-01-23 MRN: [id number]  Date of admission: 03/28/2023 Delivery date:03/31/2023  Delivering provider: Donnel Saxon  Date of discharge: 04/02/2023  Admitting diagnosis: Hypokalemia due to excessive gastrointestinal loss of potassium [E87.6] Indication for care in labor or delivery [O75.9] Intrauterine pregnancy: [redacted]w[redacted]d     Secondary diagnosis:  Principal Problem:   Indication for care in labor or delivery Active Problems:   History of marijuana use   Anemia affecting pregnancy in third trimester   History of severe pre-eclampsia   Short interval between pregnancies affecting pregnancy, antepartum   Supervision of high risk pregnancy, antepartum   Hypokalemia due to excessive gastrointestinal loss of potassium  Additional problems: Preeclampsia with severe features (BP, LFTs)    Discharge diagnosis: Term Pregnancy Delivered and Preeclampsia (severe)                                              Post partum procedures: Nexplanon Augmentation: AROM, Pitocin, Cytotec, and IP Foley Complications: None  Hospital course: Induction of Labor With Vaginal Delivery   22 y.o. yo G3P1011 at [redacted]w[redacted]d was admitted to the hospital 03/28/2023 for induction of labor.  Indication for induction:  preE w/ SF .  Patient had an labor course complicated by precipitous second stage of labor.  Membrane Rupture Time/Date: 2:51 AM ,03/31/2023   Delivery Method:Vaginal, Spontaneous  Episiotomy: None  Lacerations:  None  Details of delivery can be found in separate delivery note.  Patient had a postpartum course complicated by preeclampsia with severe features (LFTs) and hypokalemia. She received mag x 24h postpartum. LFTs improved after delivery. She was normotensive after delivery without antihypertensive medications. She received lasix PPD1, but was held PPD2 due to hypokalemia. Pt was then able to tolerate PO K supplementation and K normalized  to 3.6. Lasix was resumed with continued PO K supplementation. Patient is discharged home 04/02/23.  Newborn Data: Birth date:03/31/2023  Birth time:9:29 AM  Gender:Female  Living status:Living  Apgars:8 ,9  Weight:3380 g   Magnesium Sulfate received: Yes: Seizure prophylaxis BMZ received: No Rhophylac:No MMR:N/A T-DaP: Offered Flu: N/A Transfusion:No  Physical exam  Vitals:   04/01/23 1936 04/01/23 2324 04/02/23 0314 04/02/23 0821  BP: 114/75 117/61 117/79 (!) 111/59  Pulse: 65 75 72 (!) 56  Resp: 17 17 18 18   Temp: 98.4 F (36.9 C) 98.2 F (36.8 C) 98 F (36.7 C) (!) 97.5 F (36.4 C)  TempSrc: Oral Oral Oral Oral  SpO2: 100% 100% 99% 100%  Weight:      Height:       General: alert, cooperative, and no distress Lochia: appropriate Uterine Fundus: firm DVT Evaluation: No evidence of DVT seen on physical exam. Labs: Lab Results  Component Value Date   WBC 13.4 (H) 04/01/2023   HGB 11.7 (L) 04/01/2023   HCT 35.8 (L) 04/01/2023   MCV 82.9 04/01/2023   PLT 181 04/01/2023      Latest Ref Rng & Units 04/02/2023    5:31 AM  CMP  Glucose 70 - 99 mg/dL 81   BUN 6 - 20 mg/dL <5   Creatinine 6.43 - 1.00 mg/dL 3.29   Sodium 518 - 841 mmol/L 140   Potassium 3.5 - 5.1 mmol/L 3.6   Chloride 98 - 111 mmol/L 108   CO2 22 - 32 mmol/L  26   Calcium 8.9 - 10.3 mg/dL 8.1   Total Protein 6.5 - 8.1 g/dL 4.8   Total Bilirubin 0.3 - 1.2 mg/dL 0.8   Alkaline Phos 38 - 126 U/L 95   AST 15 - 41 U/L 41   ALT 0 - 44 U/L 74    Edinburgh Score:    04/01/2023    5:00 PM  Edinburgh Postnatal Depression Scale Screening Tool  I have been able to laugh and see the funny side of things. 0  I have looked forward with enjoyment to things. 0  I have blamed myself unnecessarily when things went wrong. 1  I have been anxious or worried for no good reason. 1  I have felt scared or panicky for no good reason. 0  Things have been getting on top of me. 1  I have been so unhappy that I have had  difficulty sleeping. 1  I have felt sad or miserable. 1  I have been so unhappy that I have been crying. 1  The thought of harming myself has occurred to me. 0  Edinburgh Postnatal Depression Scale Total 6     After visit meds:  Allergies as of 04/02/2023   No Known Allergies      Medication List     STOP taking these medications    acetaminophen 500 MG tablet Commonly known as: TYLENOL   aspirin EC 81 MG tablet   ondansetron 4 MG disintegrating tablet Commonly known as: ZOFRAN-ODT   pantoprazole 40 MG tablet Commonly known as: Protonix   promethazine 25 MG tablet Commonly known as: PHENERGAN       TAKE these medications    furosemide 20 MG tablet Commonly known as: LASIX Take 1 tablet (20 mg total) by mouth daily.   ibuprofen 600 MG tablet Commonly known as: ADVIL Take 1 tablet (600 mg total) by mouth every 6 (six) hours.   potassium chloride SA 20 MEQ tablet Commonly known as: KLOR-CON M Take 1 tablet (20 mEq total) by mouth 2 (two) times daily for 4 days.         Discharge home in stable condition Infant Feeding: Bottle and Breast Infant Disposition:home with mother Discharge instruction: per After Visit Summary and Postpartum booklet. Activity: Advance as tolerated. Pelvic rest for 6 weeks.  Diet: routine diet Future Appointments: Future Appointments  Date Time Provider Department Center  04/09/2023  2:50 PM [facility] NURSE [facility]  05/14/2023  3:50 PM Edison Bing, MD [facility]   Follow up Visit:  Follow-up Information     [facility] for [facility] Follow up on 04/09/2023.   Specialty: Obstetrics and Gynecology Why: For blood pressure check at 2:50pm Contact information: [address] [place] [address] [phone]        [facility] for [facility] at [facility] Follow up on 05/14/2023.   Specialty: Obstetrics and Gynecology Why:  Postpartum appointment at 3:50pm Contact information: [address] [place] [address] [phone]                Message sent to [facility] by [place] on 04/02/2023  Please schedule this patient for a In person postpartum visit in 6 weeks with the following provider: MD and APP. Additional Postpartum F/U:BP check 1 week and hypokalemia   High risk pregnancy complicated by:  preE w/ SF, anemia, short interval, hx of chlamydia, gonorrhea Delivery mode:  Vaginal, Spontaneous  Anticipated Birth  Control:  Nexplanon   04/02/2023 Lennart Pall, MD

## 2023-03-31 NOTE — Lactation Note (Signed)
This note was copied from a baby's chart. Lactation Consultation Note  Patient Name: Amy Singh [id number] Date: 03/31/2023 Age:21 hours Reason for consult: Other (Comment);Early term 82-38.6wks;Initial assessment (maternal THC (+))  Visited with family of 64 hours old ETI NICU female; Ms. Henrich is a P2 and has some experience breastfeeding. She voiced she has already taken baby to breast  (LATCH score 8) but also chooses to supplement with Similac 20 calorie formula. Offered assistance with latch but she politely declined, baby just fed a bottle of formula. Asked her to call for assistance when needed. Noticed there is a DEBP kit in the room; offered it to set it up but she voiced she'd like to try pumping at a later time. Reviewed normal newborn behavior, feeding cues, size of baby's stomach, lactogenesis II, pumping schedule, supplementation and anticipatory guidelines.   Maternal Data Has patient been taught Hand Expression?: Yes Does the patient have breastfeeding experience prior to this delivery?: Yes How long did the patient breastfeed?: 3-4 months  Feeding Mother's Current Feeding Choice: Breast Milk and Formula  Interventions Interventions: Breast feeding basics reviewed;Education;LC Services brochure  Plan Encouraged to take baby to breast +8 times/24 hours or sooner if feeding cues are present She'll pump whenever baby is getting a bottle with formula Parents will continue supplementing with Similac 20 calorie formula per feeding choice on admission, will try following supplementation guidelines per baby's age in hours  FOB present. All questions and concerns answered, family to contact [facility] services PRN.  Consult Status Consult Status: Follow-up Date: 04/01/23 Follow-up type: In-patient    Lillis Nuttle Venetia Constable 03/31/2023, 6:52 PM

## 2023-03-31 NOTE — Progress Notes (Signed)
Labor Progress Note  Amy Singh is a 22 y.o. G3P1011 at [redacted]w[redacted]d presented for induction of labor severe pre-E  S: Patient doing well  O:  BP (!) 140/68   Pulse 94   Temp 97.6 F (36.4 C) (Oral)   Resp 16   Ht 5\' 2"  (1.575 m)   Wt 87.7 kg   LMP 07/25/2022 (Approximate)   SpO2 100%   BMI 35.37 kg/m  EFM:140 bpm/Moderate variability/ 15x15 accels/ None decels CAT: 1 Toco: regular, every 1-4 minutes   CVE: Dilation: 5 Effacement (%): 60 Station: -2 Presentation: Vertex Exam by:: Dr. Lanae Crumbly   A&P: 22 y.o. [id number] [redacted]w[redacted]d  here for IOL as above  #Labor: Progressing well.  AROM clear, continue Pit 2 x 2 #Pain: Family/Friend support and PO/IV pain meds #FWB: CAT 1 #GBS negative # Hypokalemia: Status post multiple IV infusions of potassium, completed LR with K piggyback.  Repeat K = 3.3.  Will switch to regular LR at this time and repeat CMP in the morning # Preeclampsia with severe features: Continue magnesium gtt.  BP well-controlled.  Labetalol protocol. # Transaminitis: LFTs increasing. CTM.  [name], DO FMOB Fellow, Faculty practice [facility], Center for [facility] 03/31/23  3:02 AM

## 2023-04-01 LAB — COMPREHENSIVE METABOLIC PANEL
ALT: 80 U/L — ABNORMAL HIGH (ref 0–44)
AST: 49 U/L — ABNORMAL HIGH (ref 15–41)
Albumin: 2.2 g/dL — ABNORMAL LOW (ref 3.5–5.0)
Alkaline Phosphatase: 107 U/L (ref 38–126)
Anion gap: 10 (ref 5–15)
BUN: <5 mg/dL — ABNORMAL LOW (ref 6–20)
CO2: 25 mmol/L (ref 22–32)
Calcium: 7 mg/dL — ABNORMAL LOW (ref 8.9–10.3)
Chloride: 105 mmol/L (ref 98–111)
Creatinine, Ser: 0.58 mg/dL (ref 0.44–1.00)
GFR, Estimated: >60 mL/min (ref >60)
Glucose, Bld: 91 mg/dL (ref 70–99)
Potassium: 2.8 mmol/L — ABNORMAL LOW (ref 3.5–5.1)
Sodium: 140 mmol/L (ref 135–145)
Total Bilirubin: 1.1 mg/dL (ref 0.3–1.2)
Total Protein: 4.7 g/dL — ABNORMAL LOW (ref 6.5–8.1)

## 2023-04-01 LAB — CBC
HCT: 35.8 % — ABNORMAL LOW (ref 36.0–46.0)
Hemoglobin: 11.7 g/dL — ABNORMAL LOW (ref 12.0–15.0)
MCH: 27.1 pg (ref 26.0–34.0)
MCHC: 32.7 g/dL (ref 30.0–36.0)
MCV: 82.9 fL (ref 80.0–100.0)
Platelets: 181 10*3/uL (ref 150–400)
RBC: 4.32 MIL/uL (ref 3.87–5.11)
RDW: 17.3 % — ABNORMAL HIGH (ref 11.5–15.5)
WBC: 13.4 10*3/uL — ABNORMAL HIGH (ref 4.0–10.5)
nRBC: 0 % (ref 0.0–0.2)

## 2023-04-01 LAB — CULTURE, BETA STREP (GROUP B ONLY)

## 2023-04-01 MED ORDER — LIDOCAINE HCL 1 % IJ SOLN: 0.0000 mL | Freq: Once | INTRAMUSCULAR | Status: DC | PRN

## 2023-04-01 MED ORDER — POTASSIUM CHLORIDE CRYS ER 20 MEQ PO TBCR
40.0000 meq | EXTENDED_RELEASE_TABLET | Freq: Two times a day (BID) | ORAL | Status: DC
  Administered 2023-04-01 – 2023-04-02 (×3): 40 meq via ORAL
  Filled 2023-04-01 (×3): qty 2

## 2023-04-01 MED ORDER — POTASSIUM CHLORIDE CRYS ER 20 MEQ PO TBCR: 40.0000 meq | EXTENDED_RELEASE_TABLET | Freq: Two times a day (BID) | ORAL | Status: DC

## 2023-04-01 MED ORDER — ETONOGESTREL 68 MG ~~LOC~~ IMPL
68.0000 mg | DRUG_IMPLANT | Freq: Once | SUBCUTANEOUS | Status: DC
  Filled 2023-04-01: qty 1

## 2023-04-01 MED ORDER — POTASSIUM CHLORIDE 10 MEQ/100ML IV SOLN: 10.0000 meq | INTRAVENOUS | Status: DC

## 2023-04-01 NOTE — Clinical Social Work Maternal (Signed)
CLINICAL SOCIAL WORK MATERNAL/CHILD NOTE  Patient Details  Name: Amy Singh MRN: [id number] Date of Birth: [birth year]  Date:  04/01/2023  Clinical Social Worker Initiating Note:  Albertine Patricia Date/Time: Initiated:  04/01/23/2044     Child's Name:  Amy Singh   Biological Parents:  Mother, Father (FOB: Amy Singh, DOB: 06/09/1997)   Need for Interpreter:  None   Reason for Referral:  Current Substance Use/Substance Use During Pregnancy     Address:  [address] [place] [facility] [address] [facility] Address)  MOB's Current Address: [address] [place], [place] [address]   Phone number:  [phone] (home)     Additional phone number: FOB: Amy Singh - [phone]  Household Members/Support Persons (HM/SP):   Household Member/Support Person 1, Household Member/Support Person 2   HM/SP Name Relationship DOB or Age  HM/SP -1 Deanna Burgardt MOB's sister    HM/SP -2 Sa'Riiyah Kassiah Zeitz Daughter 09/21/2021  HM/SP -3        HM/SP -4        HM/SP -5        HM/SP -6        HM/SP -7        HM/SP -8          Natural Supports (not living in the home):  Extended Family, Immediate Family   Professional Supports: None   Employment: Full-time   Type of Work: Arts administrator   Education:  [employer] school graduate   Homebound arranged:    Surveyor, quantity Resources:  [employer], Media planner    Other Resources:  Sales executive  , [facility]   Cultural/Religious Considerations Which May Impact Care:  None identified  Strengths:  Ability to meet basic needs  , Home prepared for child  , Pediatrician chosen   Psychotropic Medications:         Pediatrician:    Armed forces operational officer area  Pediatrician List:   [facility]  [place]    [place]    [facility]      Pediatrician Fax Number:    Risk Factors/Current Problems:  Substance Use     Cognitive State:  Able to  Concentrate  , Alert  , Goal Oriented  , Linear Thinking     Mood/Affect:  Comfortable  , Interested  , Calm  , Relaxed     CSW Assessment: CSW was consulted due to marijuana use during pregnancy. CSW met with MOB at bedside to complete assessment. When CSW entered room, MOB was observed standing over infant. Infant was nearby on hospital bed and FOB was observed standing nearby. MOB provided verbal consent to complete consult with FOB present. CSW introduced self and explained reason for consult. MOB presented as calm, was agreeable to consult and remained engaged throughout encounter.   CSW confirmed demographic information on file. MOB reports her address listed on file is her grandmother's address and reports she is currently residing at [address] [place], [place] [address] with her sister and daughter. CSW inquired how MOB is feeling emotionally since infant's arrival. MOB states she feels "awesome." CSW inquired if MOB endorsed any mental health symptoms during pregnancy. MOB states she felt more emotional than usual during her pregnancy but "nothing too severe." MOB reports she was diagnosed with depression in [employer] and talked with her doctor about how she was feeling at the time. MOB recalls during high school, she was able  to cope with feelings of depression through relying on her support system. CSW inquired about symptoms of postpartum depression/anxiety after the birth of her daughter. MOB recalls endorsing symptoms of depression and anxiety after the birth of her daughter, marked by feeling sad and down that resolved about 1-2 weeks postpartum. MOB states she has not taken psychotropic medication or attended therapy in the past. MOB was agreeable to outpatient mental health resources, which CSW provided.  MOB identified her mom, sister, FOB, and grandmother as supports. MOB denied current SI/HI.   CSW provided education regarding the baby blues period vs. perinatal mood disorders,  discussed treatment and gave resources for mental health follow up if concerns arise.  CSW recommends self-evaluation during the postpartum time period using the New Mom Checklist from Postpartum Progress and encouraged MOB to contact a medical professional if symptoms are noted at any time.    MOB reports she has all needed items for infant, including a car seat and bassinet. MOB has chosen [facility] for infant's follow up care.  CSW informed MOB about hospital drug screen policy due to MOB's urine drug screen resulting positive for [facility] 03/24/2023. CSW explained that infant's UDS resulted negative for illicit substances and CDS would be monitored and a CPS report would be made if warranted. MOB expressed understanding. CSW inquired about substance use during pregnancy. MOB states that she smoked marijuana during pregnancy due to her having a hard time eating, stating the last time she smoked marijuana was about 2 weeks ago. MOB denied using other illicit substances during pregnancy. CSW inquired about prior CPS involvement. MOB states that a CPS report was made after the birth of her daughter in 2022 due to marijuana use during pregnancy. MOB reports CPS came to her home and closed the case shortly after.   CSW provided review of Sudden Infant Death Syndrome (SIDS) precautions.    CSW identifies no further need for intervention and no barriers to discharge at this time.  CSW Plan/Description:  No Further Intervention Required/No Barriers to Discharge, Sudden Infant Death Syndrome (SIDS) Education, Perinatal Mood and Anxiety Disorder (PMADs) Education, Hospital Drug Screen Policy Information, CSW Will Continue to Monitor Umbilical Cord Tissue Drug Screen Results and Make Report if Reggie Pile, LCSWA 04/01/2023, 8:47 PM

## 2023-04-01 NOTE — Progress Notes (Signed)
Daily Postpartum Note  Admission Date: 03/28/2023 Current Date: 04/01/2023  Amy Singh is a 22 y.o. [id number] PPD#1 s/p SVD/intact perineum at 37/0, admitted and induced for n/v, new LFT elevation in the setting of mild-pre-eclampsia. .  Pregnancy complicated by: Patient Active Problem List   Diagnosis Date Noted   Indication for care in labor or delivery 03/31/2023   Hypokalemia due to excessive gastrointestinal loss of potassium 03/28/2023   Mild pre-eclampsia in third trimester 03/23/2023   Chlamydia infection affecting pregnancy in first trimester 10/14/2022   Short interval between pregnancies affecting pregnancy, antepartum 10/12/2022   Supervision of high risk pregnancy, antepartum 10/12/2022   History of severe pre-eclampsia 09/21/2021   Anemia affecting pregnancy in third trimester 07/08/2021   Gonorrhea affecting pregnancy in first trimester 03/16/2021   IUFD at less than 20 weeks of gestation 11/18/2020   History of marijuana use 10/20/2020    Overnight/24hr events:  none  Subjective:  Patient feeling better. She tolerated the PO kdur w/o issue.   Objective:    Current Vital Signs 24h Vital Sign Ranges  T 97.8 F (36.6 C) Temp  Avg: 98.1 F (36.7 C)  Min: 97.7 F (36.5 C)  Max: 98.9 F (37.2 C)  BP 116/69 BP  Min: 101/49  Max: 136/80  HR (!) 58 Pulse  Avg: 75.8  Min: 58  Max: 83  RR 17 Resp  Avg: 16.7  Min: 15  Max: 20  SaO2 100 % Room Air SpO2  Avg: 99.3 %  Min: 96 %  Max: 100 %       24 Hour I/O Current Shift I/O  Time Ins Outs 07/06 0701 - 07/07 0700 In: 4546.4 [P.O.:2100; I.V.:2446.4] Out: 3012 [Urine:2900] 07/07 0701 - 07/07 1900 In: 290 [P.O.:290] Out: 1500 [Urine:1500]   Patient Vitals for the past 24 hrs:  BP Temp Temp src Pulse Resp SpO2  04/01/23 1126 116/69 97.8 F (36.6 C) Oral (!) 58 17 100 %  04/01/23 0804 118/77 97.9 F (36.6 C) Oral 78 20 99 %  04/01/23 0500 -- -- -- -- 17 --  04/01/23 0400 -- -- -- -- 17 --  04/01/23 0302 (!) 101/49  98 F (36.7 C) Oral 71 17 99 %  04/01/23 0200 -- -- -- -- 18 --  04/01/23 0100 -- -- -- -- 17 --  04/01/23 0000 -- -- -- -- 18 --  03/31/23 2345 123/86 98.2 F (36.8 C) Oral 80 17 100 %  03/31/23 2300 -- -- -- -- 18 --  03/31/23 2200 -- -- -- -- 16 --  03/31/23 2100 -- -- -- -- 16 --  03/31/23 1945 117/71 98.9 F (37.2 C) Oral 78 17 100 %  03/31/23 1900 -- -- -- -- 16 --  03/31/23 1800 -- -- -- -- 16 --  03/31/23 1700 -- -- -- -- 15 --  03/31/23 1619 136/80 98.3 F (36.8 C) Oral 77 16 100 %  03/31/23 1500 -- -- -- -- 16 --  03/31/23 1400 -- -- -- -- 15 --  03/31/23 1239 116/71 98.3 F (36.8 C) Oral 81 16 100 %  03/31/23 1141 123/82 97.7 F (36.5 C) Oral 83 16 96 %   Physical exam: General: Well nourished, well developed female in no acute distress. Abdomen: nttp, firm fundus below the umbilicus Cardiovascular: S1, S2 normal, no murmur, rub or gallop, regular rate and rhythm Respiratory: CTAB Extremities: no clubbing, cyanosis or edema Skin: Warm and dry.   Medications: Current Facility-Administered Medications  Medication Dose Route Frequency Provider Last Rate Last Admin   benzocaine-Menthol (DERMOPLAST) 20-0.5 % topical spray 1 Application  1 Application Topical PRN Autry-Lott, Simone, DO   1 Application at 03/31/23 1259   coconut oil  1 Application Topical PRN Autry-Lott, Randa Evens, DO       witch hazel-glycerin (TUCKS) pad 1 Application  1 Application Topical PRN Autry-Lott, Simone, DO       And   dibucaine (NUPERCAINAL) 1 % rectal ointment 1 Application  1 Application Rectal PRN Autry-Lott, Simone, DO       diphenhydrAMINE (BENADRYL) capsule 25 mg  25 mg Oral Q6H PRN Autry-Lott, Simone, DO       etonogestrel (NEXPLANON) implant 68 mg  68 mg Subdermal Once [place] Bing, MD       ibuprofen (ADVIL) tablet 600 mg  600 mg Oral Q6H Autry-Lott, Simone, DO   600 mg at 04/01/23 1610   lactated ringers infusion   Intravenous Continuous [place] Bing, MD   Stopped at 04/01/23  0939   lidocaine (XYLOCAINE) 1 % (with pres) injection 0-20 mL  0-20 mL Intradermal Once PRN [place] Bing, MD       ondansetron (ZOFRAN) tablet 4 mg  4 mg Oral Q4H PRN Autry-Lott, Simone, DO       Or   ondansetron (ZOFRAN) injection 4 mg  4 mg Intravenous Q4H PRN Autry-Lott, Simone, DO       potassium chloride SA (KLOR-CON M) CR tablet 40 mEq  40 mEq Oral Q12H  Bing, MD   40 mEq at 04/01/23 9604   prenatal multivitamin tablet 1 tablet  1 tablet Oral Q1200 Autry-Lott, Simone, DO   1 tablet at 03/31/23 1259   senna-docusate (Senokot-S) tablet 2 tablet  2 tablet Oral Daily Autry-Lott, Simone, DO       simethicone (MYLICON) chewable tablet 80 mg  80 mg Oral PRN Autry-Lott, Simone, DO       Tdap (BOOSTRIX) injection 0.5 mL  0.5 mL Intramuscular Once Autry-Lott, Simone, DO       zolpidem (AMBIEN) tablet 5 mg  5 mg Oral QHS PRN Autry-Lott, Randa Evens, DO        Labs:  Recent Labs  Lab 03/31/23 0037 03/31/23 0535 04/01/23 0456  WBC 11.0* 12.0* 13.4*  HGB 12.0 12.4 11.7*  HCT 36.0 38.1 35.8*  PLT 189 184 181    Recent Labs  Lab 03/31/23 0037 03/31/23 0535 04/01/23 0456  NA 136 137 140  K 3.3* 3.1* 2.8*  CL 108 105 105  CO2 19* 19* 25  BUN <5* <5* <5*  CREATININE 0.67 0.45 0.58  CALCIUM 7.4* 7.4* 7.0*  PROT 5.9* 5.9* 4.7*  BILITOT 2.0* 1.7* 1.1  ALKPHOS 137* 136* 107  ALT 97* 102* 80*  AST 74* 75* 49*  GLUCOSE 101* 104* 91   Radiology:  none  Assessment & Plan:  Patient doing well *PP: routine care. Desires nexplanon (ordered). She also desires circ (consented). A POS/rpr neg *Severe pre-eclampsia: d/c lasix due to low K. BPs normal on no meds.  *FEN/GI: repeat CMP tomorrow. Saline lock IV *Dispo: likely tomorrow.   Cornelia Copa MD Attending Center for [facility] (Faculty Practice) GYN Consult Phone: [phone] (M-F, 0800-1700) & [phone]  (Off hours, weekends, holidays)

## 2023-04-01 NOTE — Progress Notes (Signed)
Patient stating she needed to push at 0916, cervical exam was 7cm. Appr. 10 min later, 0926, she was standing stating she needed to push. This nurse asked her to lie down and allow me to check her cervix. She stated she couldn't. I notified Dr. Salvadore Dom, explained to her she needed to push and refusing a cervical exam. Baby delivered with this nurse. Autry-Lott at bedside appr. 2-3 minutes later.

## 2023-04-02 DIAGNOSIS — Z3A37 37 weeks gestation of pregnancy: Secondary | ICD-10-CM

## 2023-04-02 LAB — COMPREHENSIVE METABOLIC PANEL
ALT: 74 U/L — ABNORMAL HIGH (ref 0–44)
AST: 41 U/L (ref 15–41)
Albumin: 2.3 g/dL — ABNORMAL LOW (ref 3.5–5.0)
Alkaline Phosphatase: 95 U/L (ref 38–126)
Anion gap: 6 (ref 5–15)
BUN: <5 mg/dL — ABNORMAL LOW (ref 6–20)
CO2: 26 mmol/L (ref 22–32)
Calcium: 8.1 mg/dL — ABNORMAL LOW (ref 8.9–10.3)
Chloride: 108 mmol/L (ref 98–111)
Creatinine, Ser: 0.57 mg/dL (ref 0.44–1.00)
GFR, Estimated: >60 mL/min (ref >60)
Glucose, Bld: 81 mg/dL (ref 70–99)
Potassium: 3.6 mmol/L (ref 3.5–5.1)
Sodium: 140 mmol/L (ref 135–145)
Total Bilirubin: 0.8 mg/dL (ref 0.3–1.2)
Total Protein: 4.8 g/dL — ABNORMAL LOW (ref 6.5–8.1)

## 2023-04-02 MED ORDER — FUROSEMIDE 20 MG PO TABS: 20.0000 mg | ORAL_TABLET | Freq: Every day | ORAL | 0 refills | Status: AC

## 2023-04-02 MED ORDER — POTASSIUM CHLORIDE CRYS ER 20 MEQ PO TBCR: 20.0000 meq | EXTENDED_RELEASE_TABLET | Freq: Two times a day (BID) | ORAL | 0 refills | Status: AC

## 2023-04-02 MED ORDER — IBUPROFEN 600 MG PO TABS: 600.0000 mg | ORAL_TABLET | Freq: Four times a day (QID) | ORAL | 0 refills | Status: AC

## 2023-04-02 MED ORDER — FUROSEMIDE 20 MG PO TABS
20.0000 mg | ORAL_TABLET | Freq: Every day | ORAL | Status: DC
  Administered 2023-04-02: 20 mg via ORAL
  Filled 2023-04-02: qty 1

## 2023-04-02 NOTE — Progress Notes (Signed)
CSW acknowledged consult for "Maternal THC use during pregnancy. Need SW consult prior to discharge". MOB was seen by CSW on 04/01/23 and psychosocial assessment was completed, no barriers to discharge.   Celso Sickle, LCSW Clinical Social Worker [facility] Cell#: [phone]

## 2023-04-02 NOTE — Lactation Note (Signed)
This note was copied from a baby's chart. Lactation Consultation Note  Patient Name: Amy Singh [id number] Date: 04/02/2023 Age:21 hours Reason for consult: Follow-up assessment;Early term 37-38.6wks  LC in to visit with P2 Mom of ET infant on day of discharge. Baby is at a 5% weight loss. Mom feeling that breastfeeding is going well.  Mom has a DEBP set up at bedside, reviewed washing and drying process. Baby has been to breast and Mom supplements. Mom does NOT have a DEBP at home.  She does know how to use the hand pump attachment and states that baby is breastfeeding well now. LC offered to submit a STORK pump request, but Mom didn't want to wait the hour for it to be delivered. Engorgement prevention and treatment reviewed. Mom desires OP lactation support, message sent to clinic.  Encouraged STS with baby, offering the breast with feeding cues, goal of 8 times per 24 hrs.  IF baby is supplemented, LC recommended hand pumping to support her milk supply.  Lactation Tools Discussed/Used Tools: Pump;Flanges;Bottle Flange Size: 21 Breast pump type: Double-Electric Breast Pump Pump Education: Setup, frequency, and cleaning;Milk Storage Reason for Pumping: Mom requested Pumping frequency: Mom encouraged to pump if baby is supplemented Pumped volume: 30 mL  Interventions Interventions: Breast feeding basics reviewed;Skin to skin;Breast massage;Hand express;Hand pump;DEBP;Pace feeding  Discharge Discharge Education: Engorgement and breast care;Outpatient recommendation;Outpatient Epic message sent Pump: Manual;Advised to call insurance company (Offered to submit a STORK pump request, but Mom doesn't want to wait.)  Consult Status Consult Status: Complete Date: 04/02/23 Follow-up type: Out-patient    [name] 04/02/2023, 12:16 PM

## 2023-04-02 NOTE — Plan of Care (Signed)
  Problem: Clinical Measurements: Goal: Ability to maintain clinical measurements within normal limits will improve Outcome: Adequate for Discharge Goal: Will remain free from infection Outcome: Adequate for Discharge Goal: Diagnostic test results will improve Outcome: Adequate for Discharge Goal: Respiratory complications will improve Outcome: Adequate for Discharge Goal: Cardiovascular complication will be avoided Outcome: Adequate for Discharge   Problem: Elimination: Goal: Will not experience complications related to bowel motility Outcome: Adequate for Discharge   Problem: Pain Managment: Goal: General experience of comfort will improve Outcome: Adequate for Discharge   Problem: Safety: Goal: Ability to remain free from injury will improve Outcome: Adequate for Discharge   Problem: Skin Integrity: Goal: Risk for impaired skin integrity will decrease Outcome: Adequate for Discharge   Problem: Coping: Goal: Ability to identify and utilize available resources and services will improve Outcome: Adequate for Discharge   Problem: Life Cycle: Goal: Chance of risk for complications during the postpartum period will decrease Outcome: Adequate for Discharge

## 2023-04-04 ENCOUNTER — Encounter: Payer: BC Managed Care – PPO | Admitting: Family Medicine

## 2023-04-09 ENCOUNTER — Ambulatory Visit: Payer: BC Managed Care – PPO

## 2023-04-09 ENCOUNTER — Encounter: Payer: Self-pay | Admitting: Obstetrics and Gynecology

## 2023-04-10 ENCOUNTER — Ambulatory Visit: Payer: BC Managed Care – PPO

## 2023-04-11 ENCOUNTER — Encounter: Payer: BC Managed Care – PPO | Admitting: Obstetrics & Gynecology

## 2023-04-18 ENCOUNTER — Encounter: Payer: BC Managed Care – PPO | Admitting: Family Medicine

## 2023-04-25 ENCOUNTER — Telehealth (HOSPITAL_COMMUNITY): Payer: Self-pay | Admitting: *Deleted

## 2023-04-25 NOTE — Telephone Encounter (Signed)
04/25/2023  Name: DURA DOUTHAT MRN: [id number] DOB: 02/17/2001  Reason for Call:  Transition of Care Hospital Discharge Call  Contact Status: Patient Contact Status: Unable to contact  Language assistant needed:          Follow-Up Questions:    Inocente Salles Postnatal Depression Scale:  In the Past 7 Days:    PHQ2-9 Depression Scale:     Discharge Follow-up:    Post-discharge interventions: NA  Salena Saner, RN 04/25/2023 17:04

## 2023-05-14 ENCOUNTER — Ambulatory Visit: Payer: Medicaid Other | Admitting: Obstetrics and Gynecology

## 2023-05-14 ENCOUNTER — Encounter: Payer: Self-pay | Admitting: Obstetrics and Gynecology

## 2023-05-14 VITALS — BP 124/76 | HR 68 | Wt 181.0 lb

## 2023-05-14 DIAGNOSIS — Z3202 Encounter for pregnancy test, result negative: Secondary | ICD-10-CM

## 2023-05-14 DIAGNOSIS — Z30017 Encounter for initial prescription of implantable subdermal contraceptive: Secondary | ICD-10-CM

## 2023-05-14 DIAGNOSIS — R7401 Elevation of levels of liver transaminase levels: Secondary | ICD-10-CM | POA: Diagnosis not present

## 2023-05-14 LAB — POCT URINE PREGNANCY: Preg Test, Ur: NEGATIVE

## 2023-05-14 MED ORDER — ETONOGESTREL 68 MG ~~LOC~~ IMPL
68.0000 mg | DRUG_IMPLANT | Freq: Once | SUBCUTANEOUS | Status: AC
Start: 2023-05-14 — End: 2023-05-14
  Administered 2023-05-14: 68 mg via SUBCUTANEOUS

## 2023-05-14 NOTE — Progress Notes (Unsigned)
    Post Partum Visit Note  Amy Singh is a 22 y.o. [id number] s/p 7/6 SVD/right periuretheral (not repaired) at 37wks after mild pre-eclampsia diagnosis with concern for HELLP syndrome. Pregnancy c/b n/v of pregnancy, THC use, h/o GC/CT with negative test of cure, prior h/o severe pre-eclampsia. Anesthesia: none. Postpartum course has been uncomplicated. Baby is doing well. Baby is feeding by bottle - Similac Sensitive RS. Bleeding  Period started yesterday . Bowel function is normal. Bladder function is normal. Patient is sexually active. Contraception method is none.   Edinburgh Postnatal Depression Scale - 05/14/23 1618       Edinburgh Postnatal Depression Scale:  In the Past 7 Days   I have looked forward with enjoyment to things. 0    I have blamed myself unnecessarily when things went wrong. 1    I have been anxious or worried for no good reason. 1    I have felt scared or panicky for no good reason. 0    Things have been getting on top of me. 1    I have been so unhappy that I have had difficulty sleeping. 0    I have felt sad or miserable. 1    I have been so unhappy that I have been crying. 1    The thought of harming myself has occurred to me. 0            Review of Systems Pertinent items noted in HPI and remainder of comprehensive ROS otherwise negative.  Objective:  BP 124/76   Pulse 68   Wt 181 lb (82.1 kg)   LMP 05/13/2023 (Exact Date)   Breastfeeding Yes   BMI 33.11 kg/m    General: NAD Assessment:  Routine PP visit  Plan:  Routine care. RTC 3 months for annual, first pap and see how nexplanon is doing; patient had on in the past and was amenorrheic on it. Nexplanon placed today and was told to consider it effective in one week. Repeat CMP today to ensure it's back to normal  Leal Bing, MD Center for [employer], [facility]

## 2023-05-15 ENCOUNTER — Encounter: Payer: Self-pay | Admitting: Obstetrics and Gynecology

## 2023-05-15 HISTORY — PX: INSERTION OF IMPLANON ROD: OBO 1005

## 2023-05-15 LAB — COMPREHENSIVE METABOLIC PANEL
ALT: 20 IU/L (ref 0–32)
AST: 16 IU/L (ref 0–40)
Albumin: 4.4 g/dL (ref 4.0–5.0)
Alkaline Phosphatase: 84 IU/L (ref 42–106)
BUN/Creatinine Ratio: 14 (ref 9–23)
BUN: 10 mg/dL (ref 6–20)
Bilirubin Total: 1.1 mg/dL (ref 0.0–1.2)
CO2: 24 mmol/L (ref 20–29)
Calcium: 9.4 mg/dL (ref 8.7–10.2)
Chloride: 106 mmol/L (ref 96–106)
Creatinine, Ser: 0.7 mg/dL (ref 0.57–1.00)
Globulin, Total: 2.2 g/dL (ref 1.5–4.5)
Glucose: 78 mg/dL (ref 70–99)
Potassium: 3.8 mmol/L (ref 3.5–5.2)
Sodium: 143 mmol/L (ref 134–144)
Total Protein: 6.6 g/dL (ref 6.0–8.5)
eGFR: 127 mL/min/{1.73_m2} (ref >59)

## 2023-05-15 NOTE — Procedures (Addendum)
Nexplanon Insertion Procedure Note Patient here for postpartum appointment and desires another nexplanon; patient amenorrheic on it previously.  First period just started and UPT negative.  After informed consent was obtained, the patient's non-dominant left arm was chosen for insertion.  The old site, approximately 8 cm proximal to the medial epicondyle in the sulcus between the biceps and triceps on the inner surface, was used. The area was cleaned with alcohol then local anesthesia was infiltrated with 3 ml of 1% lidocaine along the planned insertion track. The area was prepped with betadine. Using sterile technique the Nexplanon device was inserted per manufacturer's guidelines in the subdermal connective tissue using the standard insertion technique without difficulty. Pressure was applied and the insertion site was hemostatic. The presence of the Nexplanon was confirmed immediately after insertion by palpation by both me and the patient and by checking the tip of needle for the absence of the insert.  A pressure dressing was applied.  The patient tolerated the procedure well, and was told to consider it effective in a week.   Cornelia Copa MD Attending Center for [employer] Midwife)

## 2023-07-26 ENCOUNTER — Ambulatory Visit (HOSPITAL_COMMUNITY): Admission: EM | Admit: 2023-07-26 | Discharge: 2023-07-26 | Discharge status: Against Medical Advice | Payer: 59

## 2023-07-26 NOTE — ED Notes (Signed)
This RN called pt to triage from lobby, no answer.

## 2023-07-26 NOTE — ED Notes (Signed)
Pt called 2x in lobby by clinical staff. Not present in lobby.

## 2023-08-10 ENCOUNTER — Telehealth (HOSPITAL_COMMUNITY): Payer: Self-pay

## 2023-08-10 NOTE — Telephone Encounter (Signed)
Patient calling in and needing a work note from her 07/26/23 visit. Work note created for the day of 07/26/2023 and to return to work 07/27/23. Sent to Patient's mychart.   Patient informed and verbalized understanding.

## 2023-10-23 ENCOUNTER — Ambulatory Visit: Payer: 59 | Admitting: Obstetrics and Gynecology

## 2024-11-19 ENCOUNTER — Ambulatory Visit: Admitting: Obstetrics and Gynecology
